# Patient Record
Sex: Male | Born: 1960 | Race: Black or African American | Hispanic: No | Marital: Single | Smoking: Former smoker
Health system: Southern US, Community
[De-identification: ages and names within clinical notes are randomized; demographics above are authoritative.]

## PROBLEM LIST (undated history)

## (undated) DIAGNOSIS — F419 Anxiety disorder, unspecified: Secondary | ICD-10-CM

## (undated) DIAGNOSIS — F1010 Alcohol abuse, uncomplicated: Secondary | ICD-10-CM

## (undated) DIAGNOSIS — E119 Type 2 diabetes mellitus without complications: Secondary | ICD-10-CM

## (undated) DIAGNOSIS — I10 Essential (primary) hypertension: Secondary | ICD-10-CM

## (undated) HISTORY — PX: EYE SURGERY: SHX253

---

## 2005-03-17 ENCOUNTER — Emergency Department (HOSPITAL_COMMUNITY): Admission: EM | Admit: 2005-03-17 | Discharge: 2005-03-17 | Payer: Self-pay | Admitting: Emergency Medicine

## 2005-03-20 ENCOUNTER — Emergency Department (HOSPITAL_COMMUNITY): Admission: EM | Admit: 2005-03-20 | Discharge: 2005-03-20 | Payer: Self-pay | Admitting: *Deleted

## 2006-10-25 ENCOUNTER — Emergency Department (HOSPITAL_COMMUNITY): Admission: EM | Admit: 2006-10-25 | Discharge: 2006-10-25 | Payer: Self-pay | Admitting: Emergency Medicine

## 2014-07-19 ENCOUNTER — Other Ambulatory Visit (HOSPITAL_COMMUNITY)
Admission: RE | Admit: 2014-07-19 | Discharge: 2014-07-19 | Disposition: A | Discharge status: Home / Self Care | Payer: BC Managed Care – PPO | Source: Ambulatory Visit | Attending: Family Medicine | Admitting: Family Medicine

## 2014-07-19 ENCOUNTER — Emergency Department (INDEPENDENT_AMBULATORY_CARE_PROVIDER_SITE_OTHER)
Admission: EM | Admit: 2014-07-19 | Discharge: 2014-07-19 | Disposition: A | Discharge status: Home / Self Care | Payer: BC Managed Care – PPO | Source: Home / Self Care

## 2014-07-19 DIAGNOSIS — Z202 Contact with and (suspected) exposure to infections with a predominantly sexual mode of transmission: Secondary | ICD-10-CM

## 2014-07-19 DIAGNOSIS — IMO0001 Reserved for inherently not codable concepts without codable children: Secondary | ICD-10-CM

## 2014-07-19 DIAGNOSIS — Z113 Encounter for screening for infections with a predominantly sexual mode of transmission: Secondary | ICD-10-CM | POA: Insufficient documentation

## 2014-07-19 DIAGNOSIS — R030 Elevated blood-pressure reading, without diagnosis of hypertension: Secondary | ICD-10-CM

## 2014-07-19 MED ORDER — CEFTRIAXONE SODIUM 250 MG IJ SOLR
250.0000 mg | Freq: Once | INTRAMUSCULAR | Status: AC
  Administered 2014-07-19: 250 mg via INTRAMUSCULAR

## 2014-07-19 MED ORDER — CEFTRIAXONE SODIUM 250 MG IJ SOLR
INTRAMUSCULAR | Status: AC
  Filled 2014-07-19: qty 250

## 2014-07-19 MED ORDER — AZITHROMYCIN 250 MG PO TABS
1000.0000 mg | ORAL_TABLET | Freq: Once | ORAL | Status: AC
  Administered 2014-07-19: 1000 mg via ORAL

## 2014-07-19 MED ORDER — LIDOCAINE HCL (PF) 1 % IJ SOLN
INTRAMUSCULAR | Status: AC
  Filled 2014-07-19: qty 5

## 2014-07-19 MED ORDER — AZITHROMYCIN 250 MG PO TABS
ORAL_TABLET | ORAL | Status: AC
Start: 2014-07-19 — End: 2014-07-19
  Filled 2014-07-19: qty 4

## 2014-07-19 NOTE — ED Provider Notes (Signed)
Medical screening examination/treatment/procedure(s) were performed by a resident physician or non-physician practitioner and as the supervising physician I was immediately available for consultation/collaboration.  Evan Corey, MD    Evan S Corey, MD 07/19/14 1307 

## 2014-07-19 NOTE — ED Notes (Signed)
Patient here today for STD exposure, patient denied any symptoms but states that his partner told him that she had Chlamydia and it has been several days since she told him and he wants checked and treated

## 2014-07-19 NOTE — Discharge Instructions (Signed)
PRIMARY CARE Merchant navy officer at [employer] [address]  [place], [place] [phone]  Fax [phone]  Nature conservation officer at [facility] [address]  [place], [place] [phone]  Fax [phone]  Nature conservation officer at [place] / [place] [phone] [place], [place] [phone]  Fax [phone]  [facility] at [facility] [address]  [place], [place] [phone]  Fax [phone]  [facility] [address] [place] [address] [place], [place] [phone]  Fax [phone]  [facility] at [facility] [address] [place], [place] [phone]  Fax [phone]   [facility] @ [place] [address] [place] [address] Phone: [phone]   [facility] @ [facility] [address] [place] [address] Phone: [phone]   [facility] Medicine @ [place] [address] [place] [address] Phone: [phone]   [facility] @ [place] [address] [place] [address] Phone: [phone]   [facility] @ [place] [address] [place] [address] Phone: [phone] Fax: [phone]  [facility] @ [place] [address] [place] [address] [address] Phone: [phone]    Safe Sex Safe sex is about reducing the risk of giving or getting a sexually transmitted disease (STD). STDs are spread through sexual contact involving the genitals, mouth, or rectum. Some STDs can be cured and others cannot. Safe sex can also prevent unintended pregnancies.  WHAT ARE SOME SAFE SEX PRACTICES?  Limit your sexual activity to only one partner who is having sex with only you.  Talk to your partner about his or her past partners, past STDs, and drug use.  Use a  condom every time you have sexual intercourse. This includes vaginal, oral, and anal sexual activity. Both females and males should wear condoms during oral sex. Only use latex or polyurethane condoms and water-based lubricants. Using petroleum-based lubricants or oils to lubricate a condom will weaken the condom and increase the chance that it will break. The condom should be in place from the beginning to the end of sexual activity. Wearing a condom reduces, but does not completely eliminate, your risk of getting or giving an STD. STDs can be spread by contact with infected body fluids and skin.  Get vaccinated for hepatitis B and HPV.  Avoid alcohol and recreational drugs, which can affect your judgment. You may forget to use a condom or participate in high-risk sex.  For females, avoid douching after sexual intercourse. Douching can spread an infection farther into the reproductive tract.  Check your body for signs of sores, blisters, rashes, or unusual discharge. See your health care provider if you notice any of these signs.  Avoid sexual contact if you have symptoms of an infection or are being treated for an STD. If you or your partner has herpes, avoid sexual contact when blisters are present. Use condoms at all other times.  If you are at risk of being infected with HIV, it is recommended that you take a prescription medicine daily to prevent HIV infection. This is called pre-exposure prophylaxis (PrEP). You are considered at risk if:  You are a man who has sex with other men (MSM).  You are a heterosexual man or woman who  is sexually active with more than one partner.  You take drugs by injection.  You are sexually active with a partner who has HIV.  Talk with your health care provider about whether you are at high risk of being infected with HIV. If you choose to begin PrEP, you should first be tested for HIV. You should then be tested every 3 months for as long as you are taking  PrEP.  See your health care provider for regular screenings, exams, and tests for other STDs. Before having sex with a new partner, each of you should be screened for STDs and should talk about the results with each other. WHAT ARE THE BENEFITS OF SAFE SEX?   There is less chance of getting or giving an STD.  You can prevent unwanted or unintended pregnancies.  By discussing safe sex concerns with your partner, you may increase feelings of intimacy, comfort, trust, and honesty between the two of you. Document Released: 11/23/2004 Document Revised: 03/02/2014 Document Reviewed: 04/08/2012 [facility] Patient Information 2015 [place], [place]. This information is not intended to replace advice given to you by your health care provider. Make sure you discuss any questions you have with your health care provider.

## 2014-07-19 NOTE — ED Provider Notes (Signed)
CSN: [id number]     Arrival date & time 07/19/14  1151 History   None    Chief Complaint  Patient presents with  . Exposure to STD   (Consider location/radiation/quality/duration/timing/severity/associated sxs/prior Treatment)  HPI  Patient is a 53 year old male presenting today with exposure to gonorrhea B. unprotected sex approximately one month ago. Patient admits to infrequent condom use. The patient denies any generalized malaise, fever, chills, eye problems, sore throat, skin rash or joint pain.  Denies abdominal pain, nausea, or vomiting. In addition, the patient denies any dysuria, burning, testicular pain or swelling, lesions of the penis or penile discharge.  Patient's blood pressure is elevated upon admission. Patient denies history of hypertension or elevated readings.  Denies any headache or dizziness.   No past medical history on file. No past surgical history on file. No family history on file. History  Substance Use Topics  . Smoking status: Not on file  . Smokeless tobacco: Not on file  . Alcohol Use: Not on file    Review of Systems  Allergies  Review of patient's allergies indicates no known allergies.  Home Medications   Prior to Admission medications   Not on File   BP 180/103  Pulse 82  Temp(Src) 98.9 F (37.2 C) (Oral)  Resp 16  SpO2 96%  Physical Exam  Nursing note and vitals reviewed. Constitutional: He is oriented to person, place, and time. He appears well-developed and well-nourished. No distress.  Cardiovascular: Normal rate, regular rhythm, normal heart sounds and intact distal pulses.  Exam reveals no gallop and no friction rub.   No murmur heard. Pulmonary/Chest: Effort normal and breath sounds normal. No respiratory distress. He has no wheezes. He has no rales. He exhibits no tenderness.  Abdominal: Soft. Bowel sounds are normal. He exhibits no distension and no mass. There is no tenderness. There is no rebound and no guarding.   Genitourinary: Penis normal. No penile tenderness.  Neurological: He is alert and oriented to person, place, and time.  Skin: Skin is warm and dry. No rash noted. He is not diaphoretic. No erythema. No pallor.     ED Course  Procedures (including critical care time) Labs Review Labs Reviewed  URINE CYTOLOGY ANCILLARY ONLY    Imaging Review No results found.   MDM   1. Exposure to STD   2. Elevated blood pressure    Meds ordered this encounter  Medications  . cefTRIAXone (ROCEPHIN) injection 250 mg    Sig:   . azithromycin (ZITHROMAX) tablet 1,000 mg    Sig:    Patient has insurance and advised of need of primary care provider for monitoring and long-term management.   The patient was given appropriate handouts, self care instructions, and instructed in symptomatic relief.  The patient was instructed to inform all sexual contacts, avoid intercourse completely for 2 weeks and then only with a condom. The patient advised that we would call about all abnormal lab results, and that we would need to report certain kinds of infection to the health department.  The patient advised to follow up here if no better in 3 to 4 days, or sooner if becoming worse in any way, and given some red flag symptoms such as fever, pain, or difficulty urinating which would prompt immediate return. The patient verbalizes understanding and agrees to plan of care.      Servando Salina, NP 07/19/14 1248

## 2014-07-20 LAB — URINE CYTOLOGY ANCILLARY ONLY
CHLAMYDIA, DNA PROBE: NEGATIVE
Neisseria Gonorrhea: NEGATIVE
Trichomonas: NEGATIVE

## 2014-08-29 ENCOUNTER — Encounter (HOSPITAL_COMMUNITY): Payer: Self-pay | Admitting: Emergency Medicine

## 2014-08-29 ENCOUNTER — Emergency Department (INDEPENDENT_AMBULATORY_CARE_PROVIDER_SITE_OTHER)
Admission: EM | Admit: 2014-08-29 | Discharge: 2014-08-29 | Disposition: A | Discharge status: Home / Self Care | Payer: BC Managed Care – PPO | Source: Home / Self Care

## 2014-08-29 ENCOUNTER — Other Ambulatory Visit (HOSPITAL_COMMUNITY)
Admission: RE | Admit: 2014-08-29 | Discharge: 2014-08-29 | Disposition: A | Discharge status: Home / Self Care | Payer: BC Managed Care – PPO | Source: Ambulatory Visit | Attending: Family Medicine | Admitting: Family Medicine

## 2014-08-29 DIAGNOSIS — Z113 Encounter for screening for infections with a predominantly sexual mode of transmission: Secondary | ICD-10-CM | POA: Diagnosis not present

## 2014-08-29 DIAGNOSIS — Z202 Contact with and (suspected) exposure to infections with a predominantly sexual mode of transmission: Secondary | ICD-10-CM

## 2014-08-29 HISTORY — DX: Alcohol abuse, uncomplicated: F10.10

## 2014-08-29 NOTE — ED Notes (Signed)
Pt states that he was exposed to STD over a month ago and wants to follow up with STD exposure. Pt states he has no penile discharge or pain. Pt is in no acute distress at this time.

## 2014-08-29 NOTE — ED Provider Notes (Signed)
CSN: [id number]     Arrival date & time 08/29/14  0902 History   First MD Initiated Contact with Patient 08/29/14 [phone]     Chief Complaint  Patient presents with  . Exposure to STD   (Consider location/radiation/quality/duration/timing/severity/associated sxs/prior Treatment) HPI Comments: Was here 07/19/14 for possible exposure to STD. Urine cytology was negative and empirically treated with Azithromycin and Rocephin IM He came in for another check as he has continued to have sex with his partner who continues to have a vaginal discharge. Pt st is completely asymptomatic as he was in the past visit.   Hx of HTN. He deliberately stopped his meds 1 yr ago. He was advised last visit to see PCP but has not. He is asymptomatic in regards to cardiac and Neuro sx's, st feels well overall.    Past Medical History  Diagnosis Date  . ETOH abuse    History reviewed. No pertinent past surgical history. History reviewed. No pertinent family history. History  Substance Use Topics  . Smoking status: Current Every Day Smoker    Types: Cigarettes  . Smokeless tobacco: Not on file  . Alcohol Use: Yes    Review of Systems  Constitutional: Negative.   Respiratory: Negative for cough and shortness of breath.   Cardiovascular: Negative for chest pain, palpitations and leg swelling.  Gastrointestinal: Negative for nausea, vomiting and abdominal pain.  Genitourinary: Negative for dysuria, urgency, frequency, discharge, penile swelling, scrotal swelling, genital sores, penile pain and testicular pain.  Skin: Negative.   Neurological: Negative for dizziness, tremors, seizures, syncope, facial asymmetry, speech difficulty, numbness and headaches.  All other systems reviewed and are negative.   Allergies  Review of patient's allergies indicates no known allergies.  Home Medications   Prior to Admission medications   Not on File   BP 175/115  Pulse 81  Temp(Src) 98.3 F (36.8 C) (Oral)  Resp  16  SpO2 97% Physical Exam  Nursing note and vitals reviewed. Constitutional: He is oriented to person, place, and time. He appears well-developed and well-nourished. No distress.  Neck: Normal range of motion. Neck supple.  Cardiovascular: Normal rate.   Pulmonary/Chest: Effort normal. No respiratory distress.  Musculoskeletal: He exhibits no edema.  Neurological: He is alert and oriented to person, place, and time.  Skin: Skin is warm and dry.  Psychiatric: He has a normal mood and affect.    ED Course  Procedures (including critical care time) Labs Review Labs Reviewed  URINE CYTOLOGY ANCILLARY ONLY    Imaging Review No results found.   MDM   1. Possible exposure to STD    Urine cytology pending Since he was negative for STD las t month and asymptomatic today as well, will wait on results. See [place]CP in [place] this week for BP management.      Hayden Rasmussenavid Flecia Shutter, NP 08/29/14 [phone]

## 2014-08-29 NOTE — ED Provider Notes (Signed)
Medical screening examination/treatment/procedure(s) were performed by resident physician or non-physician practitioner and as supervising physician I was immediately available for consultation/collaboration.   Jamon Hayhurst DOUGLAS MD.   Kasey Hansell D Tyheem Boughner, MD 08/29/14 1345 

## 2014-08-29 NOTE — Discharge Instructions (Signed)
Sexually Transmitted Disease °A sexually transmitted disease (STD) is a disease or infection that may be passed (transmitted) from person to person, usually during sexual activity. This may happen by way of saliva, semen, blood, vaginal mucus, or urine. Common STDs include:  °· Gonorrhea.   °· Chlamydia.   °· Syphilis.   °· HIV and AIDS.   °· Genital herpes.   °· Hepatitis B and C.   °· Trichomonas.   °· Human papillomavirus (HPV).   °· Pubic lice.   °· Scabies. °· Mites. °· Bacterial vaginosis. °WHAT ARE CAUSES OF STDs? °An STD may be caused by bacteria, a virus, or parasites. STDs are often transmitted during sexual activity if one person is infected. However, they may also be transmitted through nonsexual means. STDs may be transmitted after:  °· Sexual intercourse with an infected person.   °· Sharing sex toys with an infected person.   °· Sharing needles with an infected person or using unclean piercing or tattoo needles. °· Having intimate contact with the genitals, mouth, or rectal areas of an infected person.   °· Exposure to infected fluids during birth. °WHAT ARE THE SIGNS AND SYMPTOMS OF STDs? °Different STDs have different symptoms. Some people may not have any symptoms. If symptoms are present, they may include:  °· Painful or bloody urination.   °· Pain in the pelvis, abdomen, vagina, anus, throat, or eyes.   °· A skin rash, itching, or irritation. °· Growths, ulcerations, blisters, or sores in the genital and anal areas. °· Abnormal vaginal discharge with or without bad odor.   °· Penile discharge in men.   °· Fever.   °· Pain or bleeding during sexual intercourse.   °· Swollen glands in the groin area.   °· Yellow skin and eyes (jaundice). This is seen with hepatitis.   °· Swollen testicles. °· Infertility. °· Sores and blisters in the mouth. °HOW ARE STDs DIAGNOSED? °To make a diagnosis, your health care provider may:  °· Take a medical history.   °· Perform a physical exam.   °· Take a sample of  any discharge to examine. °· Swab the throat, cervix, opening to the penis, rectum, or vagina for testing. °· Test a sample of your first morning urine.   °· Perform blood tests.   °· Perform a Pap test, if this applies.   °· Perform a colposcopy.   °· Perform a laparoscopy.   °HOW ARE STDs TREATED? ° Treatment depends on the STD. Some STDs may be treated but not cured.  °· Chlamydia, gonorrhea, trichomonas, and syphilis can be cured with antibiotic medicine.   °· Genital herpes, hepatitis, and HIV can be treated, but not cured, with prescribed medicines. The medicines lessen symptoms.   °· Genital warts from HPV can be treated with medicine or by freezing, burning (electrocautery), or surgery. Warts may come back.   °· HPV cannot be cured with medicine or surgery. However, abnormal areas may be removed from the cervix, vagina, or vulva.   °· If your diagnosis is confirmed, your recent sexual partners need treatment. This is true even if they are symptom-free or have a negative culture or evaluation. They should not have sex until their health care providers say it is okay. °HOW CAN I REDUCE MY RISK OF GETTING AN STD? °Take these steps to reduce your risk of getting an STD: °· Use latex condoms, dental dams, and water-soluble lubricants during sexual activity. Do not use petroleum jelly or oils. °· Avoid having multiple sex partners. °· Do not have sex with someone who has other sex partners. °· Do not have sex with anyone you do not know or who is at   high risk for an STD. °· Avoid risky sex practices that can break your skin. °· Do not have sex if you have open sores on your mouth or skin. °· Avoid drinking too much alcohol or taking illegal drugs. Alcohol and drugs can affect your judgment and put you in a vulnerable position. °· Avoid engaging in oral and anal sex acts. °· Get vaccinated for HPV and hepatitis. If you have not received these vaccines in the past, talk to your health care provider about whether one  or both might be right for you.   °· If you are at risk of being infected with HIV, it is recommended that you take a prescription medicine daily to prevent HIV infection. This is called pre-exposure prophylaxis (PrEP). You are considered at risk if: °¨ You are a man who has sex with other men (MSM). °¨ You are a heterosexual man or woman and are sexually active with more than one partner. °¨ You take drugs by injection. °¨ You are sexually active with a partner who has HIV. °· Talk with your health care provider about whether you are at high risk of being infected with HIV. If you choose to begin PrEP, you should first be tested for HIV. You should then be tested every 3 months for as long as you are taking PrEP.   °WHAT SHOULD I DO IF I THINK I HAVE AN STD? °· See your health care provider.   °· Tell your sexual partner(s). They should be tested and treated for any STDs. °· Do not have sex until your health care provider says it is okay.  °WHEN SHOULD I GET IMMEDIATE MEDICAL CARE? °Contact your health care provider right away if:  °· You have severe abdominal pain. °· You are a man and notice swelling or pain in your testicles. °· You are a woman and notice swelling or pain in your vagina. °Document Released: 01/06/2003 Document Revised: 10/21/2013 Document Reviewed: 05/06/2013 °Exit[employer]® Patient Information ©2015 [employer]. This information is not intended to replace advice given to you by your health care provider. Make sure you discuss any questions you have with your health care provider. ° °Safe Sex °Safe sex is about reducing the risk of giving or getting a sexually transmitted disease (STD). STDs are spread through sexual contact involving the genitals, mouth, or rectum. Some STDs can be cured and others cannot. Safe sex can also prevent unintended pregnancies.  °WHAT ARE SOME SAFE SEX PRACTICES? °· Limit your sexual activity to only one partner who is having sex with only you. °· Talk to your partner  about his or her past partners, past STDs, and drug use. °· Use a condom every time you have sexual intercourse. This includes vaginal, oral, and anal sexual activity. Both females and males should wear condoms during oral sex. Only use latex or polyurethane condoms and water-based lubricants. Using petroleum-based lubricants or oils to lubricate a condom will weaken the condom and increase the chance that it will break. The condom should be in place from the beginning to the end of sexual activity. Wearing a condom reduces, but does not completely eliminate, your risk of getting or giving an STD. STDs can be spread by contact with infected body fluids and skin. °· Get vaccinated for hepatitis B and HPV. °· Avoid alcohol and recreational drugs, which can affect your judgment. You may forget to use a condom or participate in high-risk sex. °· For females, avoid douching after sexual intercourse. Douching can spread an infection   farther into the reproductive tract. °· Check your body for signs of sores, blisters, rashes, or unusual discharge. See your health care provider if you notice any of these signs. °· Avoid sexual contact if you have symptoms of an infection or are being treated for an STD. If you or your partner has herpes, avoid sexual contact when blisters are present. Use condoms at all other times. °· If you are at risk of being infected with HIV, it is recommended that you take a prescription medicine daily to prevent HIV infection. This is called pre-exposure prophylaxis (PrEP). You are considered at risk if: °¨ You are a man who has sex with other men (MSM). °¨ You are a heterosexual man or woman who is sexually active with more than one partner. °¨ You take drugs by injection. °¨ You are sexually active with a partner who has HIV. °· Talk with your health care provider about whether you are at high risk of being infected with HIV. If you choose to begin PrEP, you should first be tested for HIV. You  should then be tested every 3 months for as long as you are taking PrEP. °· See your health care provider for regular screenings, exams, and tests for other STDs. Before having sex with a new partner, each of you should be screened for STDs and should talk about the results with each other. °WHAT ARE THE BENEFITS OF SAFE SEX?  °· There is less chance of getting or giving an STD. °· You can prevent unwanted or unintended pregnancies. °· By discussing safe sex concerns with your partner, you may increase feelings of intimacy, comfort, trust, and honesty between the two of you. °Document Released: 11/23/2004 Document Revised: 03/02/2014 Document Reviewed: 04/08/2012 °[employer]® Patient Information ©2015 [employer]. This information is not intended to replace advice given to you by your health care provider. Make sure you discuss any questions you have with your health care provider. ° °

## 2014-08-31 ENCOUNTER — Encounter (HOSPITAL_COMMUNITY): Payer: Self-pay | Admitting: Emergency Medicine

## 2014-08-31 ENCOUNTER — Emergency Department (INDEPENDENT_AMBULATORY_CARE_PROVIDER_SITE_OTHER)
Admission: EM | Admit: 2014-08-31 | Discharge: 2014-08-31 | Disposition: A | Discharge status: Home / Self Care | Payer: BC Managed Care – PPO | Source: Home / Self Care | Attending: Emergency Medicine | Admitting: Emergency Medicine

## 2014-08-31 DIAGNOSIS — Z202 Contact with and (suspected) exposure to infections with a predominantly sexual mode of transmission: Secondary | ICD-10-CM

## 2014-08-31 LAB — URINE CYTOLOGY ANCILLARY ONLY
CHLAMYDIA, DNA PROBE: NEGATIVE
Neisseria Gonorrhea: NEGATIVE
Trichomonas: NEGATIVE

## 2014-08-31 MED ORDER — AZITHROMYCIN 250 MG PO TABS
ORAL_TABLET | ORAL | Status: AC
  Filled 2014-08-31: qty 4

## 2014-08-31 MED ORDER — CEFTRIAXONE SODIUM 250 MG IJ SOLR
250.0000 mg | Freq: Once | INTRAMUSCULAR | Status: AC
  Administered 2014-08-31: 250 mg via INTRAMUSCULAR

## 2014-08-31 MED ORDER — CEFTRIAXONE SODIUM 250 MG IJ SOLR
INTRAMUSCULAR | Status: AC
  Filled 2014-08-31: qty 250

## 2014-08-31 MED ORDER — METRONIDAZOLE 500 MG PO TABS
ORAL_TABLET | ORAL | Status: AC
  Filled 2014-08-31: qty 4

## 2014-08-31 MED ORDER — LIDOCAINE HCL (PF) 1 % IJ SOLN
INTRAMUSCULAR | Status: AC
  Filled 2014-08-31: qty 5

## 2014-08-31 MED ORDER — METRONIDAZOLE 500 MG PO TABS
2000.0000 mg | ORAL_TABLET | Freq: Once | ORAL | Status: AC
  Administered 2014-08-31: 2000 mg via ORAL

## 2014-08-31 MED ORDER — AZITHROMYCIN 250 MG PO TABS
1000.0000 mg | ORAL_TABLET | Freq: Once | ORAL | Status: AC
  Administered 2014-08-31: 1000 mg via ORAL

## 2014-08-31 NOTE — ED Notes (Signed)
Patient is aware that there is a post injection delay prior to discharge.

## 2014-08-31 NOTE — ED Notes (Signed)
Patient agreed to graham crackers/peanut butter/gingerale.  Cautioned patient needed to eat something when taking po medicines with concerns for nausea.

## 2014-08-31 NOTE — ED Notes (Signed)
Patient not ready for discharge, orders pending/antibiotic injection post-injection delay prior to discharge

## 2014-08-31 NOTE — ED Provider Notes (Signed)
  Chief Complaint   Exposure to STD   History of Present Illness   Christian Aguirre is A 53 year old male who was just here 2 days ago for STD testing. He had DNA probe was done, but declined HIV or syphilis testing. Results are not back yet. He was not treated when he was here. He found out from his partner that she tested positive for trichomonas, so he decided he would like to get treated for Trichomonas as well as other STDs. He himself denies any symptoms such as urethral discharge, burning, itching, penile pain, penile lesions, inguinal adenopathy, or testicular pain or swelling. He has had no fever, chills, sore throat, skin rash, or joint pain.  Review of Systems   Other than as noted above, the patient denies any of the following symptoms: Systemic:  No fevers chills, arthralgias, or adenopathy. GI:  No abdominal pain, nausea or vomiting. GU:  No dysuria, penile pain, discharge, itching, dysuria, genital lesions, testicular pain or swelling. Skin:  No rash or itching.  PMFSH   Past medical history, family history, social history, meds, and allergies were reviewed.   Physical Examination    Vital signs:  BP 157/103 mmHg  Pulse 80  Temp(Src) 98.2 F (36.8 C) (Oral)  Resp 16  SpO2 96% Gen:  Alert, oriented, in no distress. ENT:  Pharynx clear, no oral lesions.  Abdomen:  Soft and flat, non-distended, and non-tender.  No organomegaly or mass. Genital:  There was no urethral discharge, no penile lesions, no inguinal adenopathy, testes were normal. Skin:  Warm and dry.  No rash.   Course in Urgent Care Center   The following medications were given:  Medications  cefTRIAXone (ROCEPHIN) injection 250 mg   azithromycin (ZITHROMAX) tablet 1,000 mg   metroNIDAZOLE (FLAGYL) tablet 2,000 mg    Assessment   The encounter diagnosis was STD exposure.  Plan    1.  Meds:  The following meds were prescribed:   New Prescriptions   No medications on file    2.  Patient  Education/Counseling:  The patient was given appropriate handouts, self care instructions, and instructed in symptomatic relief.The patient was instructed to inform all sexual contacts, avoid intercourse completely for 2 weeks and then only with a condom.  The patient was told that we would call about all abnormal lab results, and that we would need to report certain kinds of infection to the health department.    3.  Follow up:  The patient was told to follow up here if no better in 3 to 4 days, or sooner if becoming worse in any way, and given some red flag symptoms such as fever, pain, or difficulty urinating which would prompt immediate return.      Christian Likesavid C Sam Overbeck, MD 08/31/14 [phone]

## 2014-08-31 NOTE — ED Notes (Signed)
Partner is being treated for trichomonas.partner treated 2 days ago.  Wants to be treated

## 2014-08-31 NOTE — Discharge Instructions (Signed)
You have been diagnosed with a possible STD.  Your results should be back in  1 - 3 days.  We will call you with the results of any positive tests, so if you don't hear from us, you can assume your results are all negative.  If you wish, you can call us here at [phone] and ask for a nurse to give you the results.  They can give you the results of all your tests over the phone.  If your HIV should come back positive, we must give you this result in person to protect your confidentiality.  We can give you a negative HIV result over the phone.   ° °In the meantime, you should avoid intercourse altogether for 1 week.  After that, you should always use condoms--100% of the time.  This will not only prevent pregnancy, but has been shown to prevent HIV, syphilis, gonorrhea, chlamydia, hepatis C and other STDs. ° °If your test comes back positive, we are required by law to report it to the Health Department.  We also suggest you inform your partner or partners so they can get tested and treated as well. ° °You can get STD testing for free at the [facility].  It is recommended that you have repeat testing for HIV and syphilis in 3 and 6 months, since it can take a while for these tests to become positive.  ° °

## 2015-12-08 DIAGNOSIS — I10 Essential (primary) hypertension: Secondary | ICD-10-CM | POA: Insufficient documentation

## 2015-12-08 DIAGNOSIS — E785 Hyperlipidemia, unspecified: Secondary | ICD-10-CM | POA: Insufficient documentation

## 2015-12-08 DIAGNOSIS — E119 Type 2 diabetes mellitus without complications: Secondary | ICD-10-CM | POA: Insufficient documentation

## 2016-08-08 ENCOUNTER — Encounter (HOSPITAL_COMMUNITY): Payer: Self-pay | Admitting: *Deleted

## 2016-08-08 ENCOUNTER — Ambulatory Visit (HOSPITAL_COMMUNITY)
Admission: EM | Admit: 2016-08-08 | Discharge: 2016-08-08 | Disposition: A | Discharge status: Home / Self Care | Payer: BLUE CROSS/BLUE SHIELD | Attending: Emergency Medicine | Admitting: Emergency Medicine

## 2016-08-08 DIAGNOSIS — H6122 Impacted cerumen, left ear: Secondary | ICD-10-CM | POA: Diagnosis not present

## 2016-08-08 HISTORY — DX: Essential (primary) hypertension: I10

## 2016-08-08 NOTE — ED Triage Notes (Signed)
Pt  Reports   Symptoms  Of  l  Ear  Fullness  Has  Tried   otc  remedys       Symptoms  X  3  Days

## 2016-08-08 NOTE — ED Provider Notes (Signed)
HPI  SUBJECTIVE:  Christian Aguirre is a 55 y.o. male who presents with left ear "clogged" for the past 2 days. He reports decreased hearing, tinnitus, some popping when yawning. He denies pain, nausea, vomiting, fevers, foreign body insertion, otorrhea, nasal congestion, URI or allergy type symptoms, recent swimming. She has tried Debrox to clean it out without much improvement. There are no other aggravating or alleviating factors. PMH of diabetes and hypertension. PM[facility], unable to remember the name.     Past Medical History:  Diagnosis Date  . ETOH abuse   . Hypertension     History reviewed. No pertinent surgical history.  History reviewed. No pertinent family history.  Social History  Substance Use Topics  . Smoking status: Never Smoker  . Smokeless tobacco: Not on file  . Alcohol use Yes    No current facility-administered medications for this encounter.   Current Outpatient Prescriptions:  Marland Kitchen.  METFORMIN HCL PO, Take by mouth., Disp: , Rfl:   No Known Allergies   ROS  As noted in HPI.   Physical Exam  BP 152/96 (BP Location: Right Arm)   Pulse 80   Temp 98.6 F (37 C) (Oral)   SpO2 100%   Constitutional: Well developed, well nourished, no acute distress Eyes:  EOMI, conjunctiva normal bilaterally HENT: Normocephalic, atraumatic,mucus membranes moist.. No nasal congestion. Positive complete obstruction of left ear with cerumen. Unable to visualize TM at all. No pain with traction on pinna. Respiratory: Normal inspiratory effort Cardiovascular: Normal rate GI: nondistended skin: No rash, skin intact Musculoskeletal: no deformities Neurologic: Alert & oriented x 3, no focal neuro deficits Psychiatric: Speech and behavior appropriate   ED Course   Medications - No data to display  Orders Placed This Encounter  Procedures  . Ear wax removal    Standing Status:   Standing    Number of Occurrences:   1    No results found for this  or any previous visit (from the past 24 hour(s)). No results found.  ED Clinical Impression  Impacted cerumen of left ear   ED Assessment/Plan  We'll have L  ear irrigated and then reevaluate.  On reevaluation, patient states that his hearing is completely recovered. Hearing is grossly intact bilaterally. External ear canal normal, TMs normal.  Meds ordered this encounter  Medications  . METFORMIN HCL PO    Sig: Take by mouth.    *This clinic note was created using Dragon dictation software. Therefore, there may be occasional mistakes despite careful proofreading.  ?   Domenick GongAshley Kirstin Kugler, MD 08/08/16 1135

## 2016-08-08 NOTE — Discharge Instructions (Signed)
Debrox once a week to prevent buildup. Do not stick Q-tips or other such things in your ear.

## 2017-07-22 ENCOUNTER — Encounter (HOSPITAL_COMMUNITY): Payer: Self-pay | Admitting: Emergency Medicine

## 2017-07-22 ENCOUNTER — Emergency Department (HOSPITAL_COMMUNITY)
Admission: EM | Admit: 2017-07-22 | Discharge: 2017-07-22 | Disposition: A | Discharge status: Home / Self Care | Payer: BLUE CROSS/BLUE SHIELD | Attending: Emergency Medicine | Admitting: Emergency Medicine

## 2017-07-22 DIAGNOSIS — I10 Essential (primary) hypertension: Secondary | ICD-10-CM | POA: Insufficient documentation

## 2017-07-22 DIAGNOSIS — K047 Periapical abscess without sinus: Secondary | ICD-10-CM | POA: Diagnosis not present

## 2017-07-22 MED ORDER — PENICILLIN V POTASSIUM 250 MG PO TABS
500.0000 mg | ORAL_TABLET | Freq: Once | ORAL | Status: AC
  Administered 2017-07-22: 500 mg via ORAL
  Filled 2017-07-22: qty 2

## 2017-07-22 MED ORDER — OXYCODONE-ACETAMINOPHEN 5-325 MG PO TABS
ORAL_TABLET | ORAL | Status: AC
  Filled 2017-07-22: qty 1

## 2017-07-22 MED ORDER — LIDOCAINE-EPINEPHRINE (PF) 2 %-1:200000 IJ SOLN
10.0000 mL | Freq: Once | INTRAMUSCULAR | Status: AC
  Administered 2017-07-22: 10 mL
  Filled 2017-07-22: qty 20

## 2017-07-22 MED ORDER — IBUPROFEN 400 MG PO TABS
400.0000 mg | ORAL_TABLET | Freq: Once | ORAL | Status: AC | PRN
  Administered 2017-07-22: 400 mg via ORAL

## 2017-07-22 MED ORDER — BUPIVACAINE-EPINEPHRINE (PF) 0.5% -1:200000 IJ SOLN
1.8000 mL | Freq: Once | INTRAMUSCULAR | Status: AC
  Administered 2017-07-22: 1.8 mL
  Filled 2017-07-22: qty 1.8

## 2017-07-22 MED ORDER — PENICILLIN V POTASSIUM 500 MG PO TABS: 500.0000 mg | ORAL_TABLET | Freq: Three times a day (TID) | ORAL | 0 refills | Status: DC

## 2017-07-22 MED ORDER — OXYCODONE-ACETAMINOPHEN 5-325 MG PO TABS: 1.0000 | ORAL_TABLET | Freq: Four times a day (QID) | ORAL | 0 refills | Status: DC | PRN

## 2017-07-22 MED ORDER — OXYCODONE-ACETAMINOPHEN 5-325 MG PO TABS
1.0000 | ORAL_TABLET | ORAL | Status: DC | PRN
  Administered 2017-07-22: 1 via ORAL

## 2017-07-22 NOTE — ED Notes (Signed)
Pt up to nurse first desk asking for pain medications, see new orders from triage standing order set.. Advised of side effects of narcotic pain medication and instructed to avoid driving for a minimum of four hours.

## 2017-07-22 NOTE — ED Provider Notes (Signed)
[facility] Provider Note   CSN: [id number] Arrival date & time: 07/22/17  7829     History   Chief Complaint Chief Complaint  Patient presents with  . Abscess    dental right lower    HPI Christian Aguirre is a 56 y.o. male.  Patient presents with complaint of right lower jaw swelling and pain starting one day ago. No associated fevers, difficult to breathing or swallowing. Patient reports having "bad teeth" in the past. No treatments prior to arrival.       Past Medical History:  Diagnosis Date  . ETOH abuse   . Hypertension     There are no active problems to display for this patient.   History reviewed. No pertinent surgical history.     Home Medications    Prior to Admission medications   Medication Sig Start Date End Date Taking? Authorizing Provider  METFORMIN HCL PO Take by mouth.    [provider]    Family History No family history on file.  Social History Social History  Substance Use Topics  . Smoking status: Never Smoker  . Smokeless tobacco: Not on file  . Alcohol use Yes     Allergies   Patient has no known allergies.   Review of Systems Review of Systems  Constitutional: Negative for fever.  HENT: Positive for dental problem and facial swelling. Negative for ear pain, sore throat and trouble swallowing.   Respiratory: Negative for shortness of breath and stridor.   Musculoskeletal: Negative for neck pain.  Skin: Negative for color change.  Neurological: Negative for headaches.     Physical Exam Updated Vital Signs BP (!) 161/100 (BP Location: Right Arm)   Pulse 74   Temp 98.2 F (36.8 C) (Oral)   Resp 18   Ht 6' (1.829 m)   Wt 113.4 kg (250 lb)   SpO2 97%   BMI 33.91 kg/m   Physical Exam  Constitutional: He appears well-developed and well-nourished.  HENT:  Head: Normocephalic and atraumatic.  Right Ear: Tympanic membrane, external ear and ear canal normal.  Left Ear: Tympanic membrane,  external ear and ear canal normal.  Nose: Nose normal.  Mouth/Throat: Uvula is midline, oropharynx is clear and moist and mucous membranes are normal. No trismus in the jaw. Abnormal dentition. Dental caries present. No dental abscesses or uvula swelling. No tonsillar abscesses.  Advanced periodontal disease with poor dentition and multiple broken teeth. There is a 2 cm superficial abscess noted along the right gumline near the premolars. Mild fluctuance.  Eyes: Pupils are equal, round, and reactive to light.  Neck: Normal range of motion. Neck supple.  No neck swelling or Lugwig's angina  Neurological: He is alert.  Skin: Skin is warm and dry.  Psychiatric: He has a normal mood and affect.  Nursing note and vitals reviewed.    ED Treatments / Results   Procedures .Marland KitchenIncision and Drainage Date/Time: 07/22/2017 8:39 AM Performed by: Renne Crigler Authorized by: Renne Crigler   Consent:    Consent obtained:  Verbal   Consent given by:  Patient   Risks discussed:  Bleeding, pain and incomplete drainage Location:    Type:  Abscess   Location:  Mouth   Mouth location:  Submandibular space Anesthesia (see MAR for exact dosages):    Anesthesia method:  Local infiltration   Local anesthetic:  Bupivacaine 0.5% WITH epi and lidocaine 2% WITH epi Procedure type:    Complexity:  Simple Procedure details:  Incision types:  Stab incision   Scalpel blade:  11   Wound management:  Probed and deloculated   Drainage:  Bloody and purulent   Drainage amount:  Moderate   Wound treatment:  Wound left open   (including critical care time)  Medications Ordered in ED Medications  oxyCODONE-acetaminophen (PERCOCET/ROXICET) 5-325 MG per tablet 1 tablet (1 tablet Oral Given 07/22/17 0502)  penicillin v potassium (VEETID) tablet 500 mg (not administered)  ibuprofen (ADVIL,MOTRIN) tablet 400 mg (400 mg Oral Given 07/22/17 0104)  bupivacaine-epinephrine (MARCAINE W/ EPI) 0.5% -1:200000 injection  1.8 mL (1.8 mLs Infiltration Given 07/22/17 0813)  lidocaine-EPINEPHrine (XYLOCAINE W/EPI) 2 %-1:200000 (PF) injection 10 mL (10 mLs Infiltration Given 07/22/17 0813)     Initial Impression / Assessment and Plan / ED Course  I have reviewed the triage vital signs and the nursing notes.  Pertinent labs & imaging results that were available during my care of the patient were reviewed by me and considered in my medical decision making (see chart for details).     Patient seen and examined. Explained I&D procedure. Pt agrees to proceed. Anticipate d/c home with penicillin and pain medication. [place] controlled substance reporting database reviewed.   Vital signs reviewed and are as follows: BP (!) 161/100 (BP Location: Right Arm)   Pulse 74   Temp 98.2 F (36.8 C) (Oral)   Resp 18   Ht 6' (1.829 m)   Wt 113.4 kg (250 lb)   SpO2 97%   BMI 33.91 kg/m   Patient counseled on use of narcotic pain medications. Counseled not to combine these medications with others containing tylenol. Urged not to drink alcohol, drive, or perform any other activities that requires focus while taking these medications. The patient verbalizes understanding and agrees with the plan.  Patient counseled to take prescribed medications as directed, return with worsening facial or neck swelling, and to follow-up with their dentist as soon as possible.     Final Clinical Impressions(s) / ED Diagnoses   Final diagnoses:  Dental abscess   Patient with toothache. No fever. Exam unconcerning for Ludwig's angina or other deep tissue infection in neck.   Incision and drainage performed as above.  As there is gum swelling, erythema, and facial swelling, will treat with antibiotic and pain medicine. Urged patient to follow-up with dentist.    New Prescriptions New Prescriptions   OXYCODONE-ACETAMINOPHEN (PERCOCET/ROXICET) 5-325 MG TABLET    Take 1-2 tablets by mouth every 6 (six) hours as needed for severe pain.    PENICILLIN V POTASSIUM (VEETID) 500 MG TABLET    Take 1 tablet (500 mg total) by mouth 3 (three) times daily.     Renne Crigler, PA-C 07/22/17 0840    Margarita Grizzle, MD 07/23/17 1115

## 2017-07-22 NOTE — Discharge Instructions (Signed)
Please read and follow all provided instructions.  Your diagnoses today include:  1. Dental abscess     The exam and treatment you received today has been provided on an emergency basis only. This is not a substitute for complete medical or dental care.  Tests performed today include:  Vital signs. See below for your results today.   Medications prescribed:   Percocet (oxycodone/acetaminophen) - narcotic pain medication  DO NOT drive or perform any activities that require you to be awake and alert because this medicine can make you drowsy. BE VERY CAREFUL not to take multiple medicines containing Tylenol (also called acetaminophen). Doing so can lead to an overdose which can damage your liver and cause liver failure and possibly death.   Penicillin - antibiotic  You have been prescribed an antibiotic medicine: take the entire course of medicine even if you are feeling better. Stopping early can cause the antibiotic not to work.  Take any prescribed medications only as directed.  Home care instructions:  Follow any educational materials contained in this packet.  Follow-up instructions: Please follow-up with your dentist for further evaluation of your symptoms.   Dental Assistance: See attached for dental referrals  Return instructions:   Please return to the Emergency Department if you experience worsening symptoms.  Please return if you develop a fever, you develop more swelling in your face or neck, you have trouble breathing or swallowing food.  Please return if you have any other emergent concerns.  Additional Information:  Your vital signs today were: BP (!) 130/100 (BP Location: Right Arm)    Pulse 74    Temp 98 F (36.7 C) (Oral)    Resp 16    Ht 6' (1.829 m)    Wt 113.4 kg (250 lb)    SpO2 99%    BMI 33.91 kg/m  If your blood pressure (BP) was elevated above 135/85 this visit, please have this repeated by your doctor within one month. --------------

## 2017-07-22 NOTE — ED Triage Notes (Signed)
Pt c/o 10/10 right lower dental pain and swollen since this morning with not relief, pt denies any fever or chills.

## 2017-10-29 ENCOUNTER — Ambulatory Visit (HOSPITAL_COMMUNITY)
Admission: EM | Admit: 2017-10-29 | Discharge: 2017-10-29 | Disposition: A | Discharge status: Home / Self Care | Payer: BLUE CROSS/BLUE SHIELD | Attending: Family Medicine | Admitting: Family Medicine

## 2017-10-29 ENCOUNTER — Other Ambulatory Visit: Payer: Self-pay

## 2017-10-29 DIAGNOSIS — R208 Other disturbances of skin sensation: Secondary | ICD-10-CM | POA: Insufficient documentation

## 2017-10-29 DIAGNOSIS — N4889 Other specified disorders of penis: Secondary | ICD-10-CM | POA: Diagnosis not present

## 2017-10-29 DIAGNOSIS — I10 Essential (primary) hypertension: Secondary | ICD-10-CM | POA: Diagnosis not present

## 2017-10-29 DIAGNOSIS — Z113 Encounter for screening for infections with a predominantly sexual mode of transmission: Secondary | ICD-10-CM | POA: Diagnosis not present

## 2017-10-29 DIAGNOSIS — Z202 Contact with and (suspected) exposure to infections with a predominantly sexual mode of transmission: Secondary | ICD-10-CM

## 2017-10-29 LAB — POCT URINALYSIS DIP (DEVICE)
Bilirubin Urine: NEGATIVE
Glucose, UA: NEGATIVE mg/dL
HGB URINE DIPSTICK: NEGATIVE
Ketones, ur: NEGATIVE mg/dL
Leukocytes, UA: NEGATIVE
Nitrite: NEGATIVE
Protein, ur: NEGATIVE mg/dL
Specific Gravity, Urine: >=1.03 (ref 1.005–1.030)
UROBILINOGEN UA: 0.2 mg/dL (ref 0.0–1.0)
pH: 5.5 (ref 5.0–8.0)

## 2017-10-29 MED ORDER — AZITHROMYCIN 250 MG PO TABS
1000.0000 mg | ORAL_TABLET | Freq: Once | ORAL | Status: AC
  Administered 2017-10-29: 1000 mg via ORAL

## 2017-10-29 MED ORDER — AZITHROMYCIN 250 MG PO TABS
ORAL_TABLET | ORAL | Status: AC
  Filled 2017-10-29: qty 4

## 2017-10-29 MED ORDER — CEFTRIAXONE SODIUM 250 MG IJ SOLR
250.0000 mg | Freq: Once | INTRAMUSCULAR | Status: AC
  Administered 2017-10-29: 250 mg via INTRAMUSCULAR

## 2017-10-29 MED ORDER — CEFTRIAXONE SODIUM 250 MG IJ SOLR
INTRAMUSCULAR | Status: AC
  Filled 2017-10-29: qty 250

## 2017-10-29 NOTE — ED Provider Notes (Signed)
  [facility]   [id number] 10/29/17 Arrival Time: 1006  ASSESSMENT & PLAN:  1. Possible exposure to STD    Meds ordered this encounter  Medications  . cefTRIAXone (ROCEPHIN) injection 250 mg  . azithromycin (ZITHROMAX) tablet 1,000 mg   Urine cytology sent. Will notify of any positive results. Instructed to refrain from sexual activity for at least seven days.  Reviewed expectations re: course of current medical issues. Questions answered. Outlined signs and symptoms indicating need for more acute intervention. Patient verbalized understanding. After Visit Summary given.   SUBJECTIVE:  Christian Aguirre is a 56 y.o. male who presents with complaint of possible exposure to STD. Reports having intercourse with a new male partner when his condom slipped off. This was several days ago. Now reports slight burning at tip of penis with urination. No urinary frequency. No penile d/c reported. Afebrile. No abdominal or pelvic pain. No n/v. No rashes or lesions.  OTC treatment: None. History of similar symptoms: No.  ROS: As per HPI.  OBJECTIVE:  Vitals:   10/29/17 1031  BP: (!) 152/94  Pulse: 82  Temp: 98.5 F (36.9 C)  TempSrc: Oral  SpO2: 100%    General appearance: alert, cooperative, appears stated age and no distress Throat: lips, mucosa, and tongue normal; teeth and gums normal Back: no CVA tenderness Abdomen: soft, non-tender; bowel sounds normal; no masses or organomegaly; no guarding or rebound tenderness GU: declines Skin: warm and dry Psychological:  Alert and cooperative. Normal mood and affect.  Results for orders placed or performed during the hospital encounter of 10/29/17  POCT urinalysis dip (device)  Result Value Ref Range   Glucose, UA NEGATIVE NEGATIVE mg/dL   Bilirubin Urine NEGATIVE NEGATIVE   Ketones, ur NEGATIVE NEGATIVE mg/dL   Specific Gravity, Urine >=1.030 1.005 - 1.030   Hgb urine dipstick NEGATIVE NEGATIVE   pH 5.5 5.0 - 8.0   Protein, ur NEGATIVE NEGATIVE mg/dL   Urobilinogen, UA 0.2 0.0 - 1.0 mg/dL   Nitrite NEGATIVE NEGATIVE   Leukocytes, UA NEGATIVE NEGATIVE    Labs Reviewed  POCT URINALYSIS DIP (DEVICE)  URINE CYTOLOGY ANCILLARY ONLY    No Known Allergies  Past Medical History:  Diagnosis Date  . ETOH abuse   . Hypertension    No family history on file. Social History   Socioeconomic History  . Marital status: Single    Spouse name: Not on file  . Number of children: Not on file  . Years of education: Not on file  . Highest education level: Not on file  Social Needs  . Financial resource strain: Not on file  . Food insecurity - worry: Not on file  . Food insecurity - inability: Not on file  . Transportation needs - medical: Not on file  . Transportation needs - non-medical: Not on file  Occupational History  . Not on file  Tobacco Use  . Smoking status: Never Smoker  Substance and Sexual Activity  . Alcohol use: Yes  . Drug use: Yes    Types: Marijuana  . Sexual activity: Yes  Other Topics Concern  . Not on file  Social History Narrative  . Not on file          Mardella LaymanHagler, Christian Filippini, MD 10/29/17 1455

## 2017-10-29 NOTE — ED Triage Notes (Signed)
Per pt he thinks he has STD, per pt he is having burning and discomfort, per pt no abnormal discharge

## 2017-10-29 NOTE — Discharge Instructions (Addendum)
You have been given the following medications today for treatment of suspected gonorrhea and/or chlamydia: ° °cefTRIAXone (ROCEPHIN) injection 250 mg °azithromycin (ZITHROMAX) tablet 1,000 mg ° °Even though we have treated you today, we have sent testing for sexually transmitted infections. We will notify you of any positive results once they are received. If required, we will prescribe any medications you might need. ° °

## 2017-10-31 LAB — URINE CYTOLOGY ANCILLARY ONLY
Chlamydia: NEGATIVE
Neisseria Gonorrhea: NEGATIVE
Trichomonas: NEGATIVE

## 2017-12-11 ENCOUNTER — Emergency Department (HOSPITAL_BASED_OUTPATIENT_CLINIC_OR_DEPARTMENT_OTHER)
Admission: EM | Admit: 2017-12-11 | Discharge: 2017-12-11 | Disposition: A | Discharge status: Home / Self Care | Payer: BLUE CROSS/BLUE SHIELD | Attending: Emergency Medicine | Admitting: Emergency Medicine

## 2017-12-11 ENCOUNTER — Emergency Department (HOSPITAL_BASED_OUTPATIENT_CLINIC_OR_DEPARTMENT_OTHER): Payer: BLUE CROSS/BLUE SHIELD

## 2017-12-11 ENCOUNTER — Other Ambulatory Visit: Payer: Self-pay

## 2017-12-11 ENCOUNTER — Encounter (HOSPITAL_BASED_OUTPATIENT_CLINIC_OR_DEPARTMENT_OTHER): Payer: Self-pay | Admitting: Emergency Medicine

## 2017-12-11 DIAGNOSIS — R002 Palpitations: Secondary | ICD-10-CM | POA: Diagnosis not present

## 2017-12-11 DIAGNOSIS — R079 Chest pain, unspecified: Secondary | ICD-10-CM | POA: Diagnosis present

## 2017-12-11 DIAGNOSIS — E119 Type 2 diabetes mellitus without complications: Secondary | ICD-10-CM | POA: Insufficient documentation

## 2017-12-11 DIAGNOSIS — Z79899 Other long term (current) drug therapy: Secondary | ICD-10-CM | POA: Diagnosis not present

## 2017-12-11 DIAGNOSIS — R072 Precordial pain: Secondary | ICD-10-CM | POA: Diagnosis not present

## 2017-12-11 HISTORY — DX: Type 2 diabetes mellitus without complications: E11.9

## 2017-12-11 LAB — BASIC METABOLIC PANEL
ANION GAP: 9 (ref 5–15)
BUN: 12 mg/dL (ref 6–20)
CHLORIDE: 102 mmol/L (ref 101–111)
CO2: 28 mmol/L (ref 22–32)
CREATININE: 0.92 mg/dL (ref 0.61–1.24)
Calcium: 9.4 mg/dL (ref 8.9–10.3)
GFR calc non Af Amer: >60 mL/min (ref >60)
Glucose, Bld: 112 mg/dL — ABNORMAL HIGH (ref 65–99)
POTASSIUM: 3.7 mmol/L (ref 3.5–5.1)
SODIUM: 139 mmol/L (ref 135–145)

## 2017-12-11 LAB — CBC
HCT: 44.2 % (ref 39.0–52.0)
HEMOGLOBIN: 14.9 g/dL (ref 13.0–17.0)
MCH: 30.3 pg (ref 26.0–34.0)
MCHC: 33.7 g/dL (ref 30.0–36.0)
MCV: 90 fL (ref 78.0–100.0)
PLATELETS: 264 10*3/uL (ref 150–400)
RBC: 4.91 MIL/uL (ref 4.22–5.81)
RDW: 13.1 % (ref 11.5–15.5)
WBC: 6.6 10*3/uL (ref 4.0–10.5)

## 2017-12-11 LAB — D-DIMER, QUANTITATIVE: D-Dimer, Quant: <0.27 ug/mL-FEU (ref 0.00–0.50)

## 2017-12-11 LAB — TROPONIN I: Troponin I: <0.03 ng/mL (ref <0.03)

## 2017-12-11 NOTE — ED Provider Notes (Signed)
[facility] EMERGENCY DEPARTMENT Provider Note   CSN: [id number] Arrival date & time: 12/11/17  0935     History   Chief Complaint Chief Complaint  Patient presents with  . Chest Pain    HPI Christian Aguirre is a 57 y.o. male with history of rheumatic fever, hypertension, hyperlipidemia, diabetes on metformin, daily EtOH use, marijuana use and remote history of tobacco use here for evaluation of left-sided rib cage stabbing pain, intermittent that radiates up to his left armpit and left anterior shoulder for 3 days. Associated symptoms include sweating, palpitations described as my heart is fluttering, intermittent dizziness. States the symptoms are random and happen while he is at rest. Not exertional non pleuritic stabbing chest pain. Has felt his heart fluttering while in the emergency department and on the cardiac monitor. Reports remote history of similar heart fluttering several years ago that worsened with cigarette use and caffeine use. He stopped smoking cigarettes close to 10 years ago and tries to avoid caffeine because he knows these things make his heart fluttering worse. Last time he smoked marijuana was 3 days ago. He does drink a lot of  but denies any other caffeine intake. Drinks approximately 16 ounces of water a day.   No known h/o heart disease, MI, arrhythmias, DVT/PE. No leg swelling, orthopnea, PND, cough, recent illness, acid reflux, dysphagia, abdominal pain. No intervention PTA. No alleviating or aggravating factors.  HPI  Past Medical History:  Diagnosis Date  . Diabetes mellitus without complication (HCC)   . ETOH abuse   . Hypertension     There are no active problems to display for this patient.   History reviewed. No pertinent surgical history.     Home Medications    Prior to Admission medications   Medication Sig Start Date End Date Taking? Authorizing Provider  amLODipine (NORVASC) 10 MG tablet Take 10 mg by mouth daily.   Yes  [provider]  lisinopril (PRINIVIL,ZESTRIL) 20 MG tablet Take 20 mg by mouth daily.   Yes [provider]  metoprolol succinate (TOPROL-XL) 50 MG 24 hr tablet Take 50 mg by mouth daily. Take with or immediately following a meal.   Yes [provider]  simvastatin (ZOCOR) 10 MG tablet Take 10 mg by mouth daily.   Yes [provider]  METFORMIN HCL PO Take by mouth.    [provider]  oxyCODONE-acetaminophen (PERCOCET/ROXICET) 5-325 MG tablet Take 1-2 tablets by mouth every 6 (six) hours as needed for severe pain. 07/22/17   Renne Crigler, PA-C  penicillin v potassium (VEETID) 500 MG tablet Take 1 tablet (500 mg total) by mouth 3 (three) times daily. 07/22/17   Renne Crigler, PA-C    Family History History reviewed. No pertinent family history.  Social History Social History   Tobacco Use  . Smoking status: Never Smoker  . Smokeless tobacco: Never Used  Substance Use Topics  . Alcohol use: Yes  . Drug use: Yes    Types: Marijuana     Allergies   Patient has no known allergies.   Review of Systems Review of Systems  Constitutional: Positive for diaphoresis.  Cardiovascular: Positive for chest pain and palpitations.  Neurological: Positive for dizziness.  All other systems reviewed and are negative.    Physical Exam Updated Vital Signs BP (!) 146/95   Pulse 74   Temp 98.3 F (36.8 C) (Oral)   Resp 20   Ht 6' (1.829 m)   Wt 113.4 kg (250 lb)  SpO2 100%   BMI 33.91 kg/m   Physical Exam  Constitutional: He appears well-developed and well-nourished.  NAD. Non toxic.   HENT:  Head: Normocephalic and atraumatic.  Nose: Nose normal.  Moist mucous membranes. Tonsils and oropharynx normal  Eyes: Conjunctivae, EOM and lids are normal.  Neck: Trachea normal and normal range of motion.  Neck is supple Trachea midline No cervical adenopathy  Cardiovascular: Normal rate, regular rhythm, S1 normal, S2 normal and normal  heart sounds.  Pulses:      Carotid pulses are 2+ on the right side, and 2+ on the left side.      Radial pulses are 2+ on the right side, and 2+ on the left side.       Dorsalis pedis pulses are 2+ on the right side, and 2+ on the left side.  RRR. No orthopnea. No LE edema or calf tenderness.   Pulmonary/Chest: Effort normal and breath sounds normal. No respiratory distress. He has no decreased breath sounds. He has no rhonchi.  No reproducible chest wall tenderness. CP not reproducible with AROM of upper extremities. No rales or wheezing.  Abdominal: Soft. Bowel sounds are normal. There is no tenderness.  No epigastric tenderness. No distention.   Neurological: He is alert. GCS eye subscore is 4. GCS verbal subscore is 5. GCS motor subscore is 6.  Skin: Skin is warm and dry. Capillary refill takes less than 2 seconds.  No rash to chest wall  Psychiatric: He has a normal mood and affect. His speech is normal and behavior is normal. Judgment and thought content normal. Cognition and memory are normal.     ED Treatments / Results  Labs (all labs ordered are listed, but only abnormal results are displayed) Labs Reviewed  BASIC METABOLIC PANEL - Abnormal; Notable for the following components:      Result Value   Glucose, Bld 112 (*)    All other components within normal limits  CBC  TROPONIN I  D-DIMER, QUANTITATIVE (NOT AT [facility])  TROPONIN I    EKG  EKG Interpretation  Date/Time:  Tuesday December 11 2017 10:44:08 EST Ventricular Rate:  70 PR Interval:    QRS Duration: 104 QT Interval:  411 QTC Calculation: 444 R Axis:   15 Text Interpretation:  Sinus rhythm no acute ST/T changes no significant change since earlier in the day Confirmed by Pricilla LovelessGoldston, Scott [phone]([id number]) on 12/11/2017 11:51:47 AM       Radiology Dg Chest 2 View  Result Date: 12/11/2017 CLINICAL DATA:  Chest pain, shortness of breath, diaphoresis, and dizziness for several days. EXAM: CHEST  2 VIEW COMPARISON:   None. FINDINGS: The cardiomediastinal silhouette is within normal limits. The lungs are well inflated and clear. There is no evidence of pleural effusion or pneumothorax. Thoracic spondylosis and mild dextroscoliosis are noted. IMPRESSION: No active cardiopulmonary disease. Electronically Signed   By: Sebastian AcheAllen  Grady M.D.   On: 12/11/2017 10:15    Procedures Procedures (including critical care time)  Medications Ordered in ED Medications - No data to display   Initial Impression / Assessment and Plan / ED Course  I have reviewed the triage vital signs and the nursing notes.  Pertinent labs & imaging results that were available during my care of the patient were reviewed by me and considered in my medical decision making (see chart for details).     Pt is a 57 y.o. male presents with palpitations and left lateral rib cage pain described as sharp, intermittent  Non exertional, positional or pleuritic.  Pertinent risk factors include HTN, hypercholesterolemia, DM, obesity.  However symptomatology sounds atypical. On exam VS are wnl. Cardiovascular and pulmonary exam benign. CXR, EKG, troponin x 2 within normal limits.  CBC and BMP unremarkable.  D-dimer negative. Heart score = 3.  Not orthostatic in ED. Has had intermittent "heart fluttering in ED" however no arrhythmias on cardiac monitor noted.. Patient has ambulated and tolerated PO in ED. Given symptoms, reassuring ED work up, low risk HEART score patient will be discharged with recommendation to follow up with PCP and cardiologist in regards to today's hospital visit. ED return preacutions given. Pt appears reliable for follow up and is agreeable to discharge. Pt discussed with SP. Encouraged to cut back on alcohol intake, tea with caffeine and marijuana use as these may be contributing to palpitations. No signs of anemia. No hypoglycemia. Pt likely need thyroid panel, holter monitor and cardiac work up as outpatient basis given risk factors or heart  disease.   Final Clinical Impressions(s) / ED Diagnoses   Final diagnoses:  Precordial pain  Palpitations    ED Discharge Orders    None        Liberty Handy, PA-C 12/11/17 1400    Pricilla Loveless, MD 12/11/17 1556

## 2017-12-11 NOTE — ED Notes (Signed)
Pt is ambulatory to the bathroom with no difficulty.

## 2017-12-11 NOTE — ED Notes (Signed)
He wants to know how much longer before he can leave.

## 2017-12-11 NOTE — ED Notes (Signed)
Plan to draw 2nd Troponin at 1pm.

## 2017-12-11 NOTE — ED Triage Notes (Signed)
Patient states that he has had numbness and tingling to his left arm with chest pain and SOB x the last 2 -3 days

## 2017-12-11 NOTE — Discharge Instructions (Signed)
Some of your symptoms and palpitations may be from on-call use, marijuana use or caffeine intake in tea. Stay well-hydrated.  Your workup including lab work, heart enzymes, chest x-ray, EKG were all reassuring today. As we discussed you have some risk factors of heart disease and I recommend he follow up with a cardiologist in the next week or so for continued outpatient workup and evaluation of your heart function. They may recommend a Holter monitor, stress tests or ultrasound of your heart. They may also add thyroid panel. Return to the emergency department if chest pain becomes exertional, you have shortness of breath, fevers, chills, cough, nausea, vomiting, pass out.

## 2017-12-14 ENCOUNTER — Other Ambulatory Visit: Payer: Self-pay

## 2017-12-17 ENCOUNTER — Ambulatory Visit (INDEPENDENT_AMBULATORY_CARE_PROVIDER_SITE_OTHER): Payer: BLUE CROSS/BLUE SHIELD | Admitting: Cardiology

## 2017-12-17 ENCOUNTER — Encounter: Payer: Self-pay | Admitting: Cardiology

## 2017-12-17 ENCOUNTER — Other Ambulatory Visit: Payer: Self-pay

## 2017-12-17 ENCOUNTER — Telehealth: Payer: Self-pay

## 2017-12-17 VITALS — BP 142/78 | HR 76 | Ht 73.0 in | Wt 241.4 lb

## 2017-12-17 DIAGNOSIS — Z87891 Personal history of nicotine dependence: Secondary | ICD-10-CM | POA: Diagnosis not present

## 2017-12-17 DIAGNOSIS — R0789 Other chest pain: Secondary | ICD-10-CM | POA: Insufficient documentation

## 2017-12-17 DIAGNOSIS — E782 Mixed hyperlipidemia: Secondary | ICD-10-CM | POA: Diagnosis not present

## 2017-12-17 DIAGNOSIS — I10 Essential (primary) hypertension: Secondary | ICD-10-CM

## 2017-12-17 DIAGNOSIS — E1121 Type 2 diabetes mellitus with diabetic nephropathy: Secondary | ICD-10-CM | POA: Diagnosis not present

## 2017-12-17 MED ORDER — ALPRAZOLAM 0.5 MG PO TABS: 0.2500 mg | ORAL_TABLET | Freq: Every evening | ORAL | 0 refills | Status: AC | PRN

## 2017-12-17 MED ORDER — NITROGLYCERIN 0.4 MG SL SUBL: 0.4000 mg | SUBLINGUAL_TABLET | SUBLINGUAL | 6 refills | Status: AC | PRN

## 2017-12-17 MED ORDER — ALPRAZOLAM 0.5 MG PO TABS
0.5000 mg | ORAL_TABLET | Freq: Every evening | ORAL | 0 refills | Status: DC | PRN
Start: 2017-12-17 — End: 2017-12-17

## 2017-12-17 MED ORDER — ASPIRIN EC 81 MG PO TBEC: 162.0000 mg | DELAYED_RELEASE_TABLET | Freq: Every day | ORAL | 3 refills | Status: DC

## 2017-12-17 NOTE — Progress Notes (Signed)
Cardiology Office Note:    Date:  12/17/2017   ID:  Christian Aguirre, DOB [id number], MRN [id number]  PCP:  Patient, No Pcp Per  Cardiologist:  Garwin Brothers, MD   Referring MD: No ref. provider found    ASSESSMENT:    1. Essential hypertension   2. Controlled type 2 diabetes mellitus with diabetic nephropathy, without long-term current use of insulin (HCC)   3. Chest pain, atypical   4. Mixed hyperlipidemia    PLAN:    In order of problems listed above:  1. Primary prevention stressed with the patient.  Importance of compliance with diet and medications stressed and he vocalized understanding.  He needs to take coated aspirin on a regular basis and he is fine with it.  I told him to check with his primary care physician about this to see if there is any contraindication.  He has taken it in the past without any problems.  Diet was discussed for dyslipidemia and essential hypertension and lifestyle modification was stressed.  His blood pressure is stable. 2. Sublingual nitroglycerin prescription was sent, its protocol and 911 protocol explained and the patient vocalized understanding questions were answered to the patient's satisfaction 3. I mentioned to the patient that his chest pain is very atypical however in view of multiple risk factors he will undergo exercise stress echo.  He will be seen in follow-up appointment in a month or earlier if he has any concerns.  He knows to go to the nearest emergency room for any significant concerns.   Medication Adjustments/Labs and Tests Ordered: Current medicines are reviewed at length with the patient today.  Concerns regarding medicines are outlined above.  Orders Placed This Encounter  Procedures  . ECHOCARDIOGRAM STRESS TEST   Meds ordered this encounter  Medications  . aspirin EC 81 MG tablet    Sig: Take 2 tablets (162 mg total) by mouth daily.    Dispense:  90 tablet    Refill:  3  . nitroGLYCERIN (NITROSTAT) 0.4 MG SL  tablet    Sig: Place 1 tablet (0.4 mg total) under the tongue every 5 (five) minutes as needed.    Dispense:  11 tablet    Refill:  6     History of Present Illness:    Christian Aguirre is a 57 y.o. male who is being seen today for the evaluation of chest pain at the request of the emergency room.  Patient is a pleasant 57 year old male.  He has past medical history of essential hypertension, dyslipidemia and diabetes mellitus.  He is ex-smoker and quit 8 years ago.  He mentions to me he has tingling sensation in the left arm.  This is not related to exertion.  No chest pain orthopnea or PND.  He went to the emergency room and is very concerned of his symptoms.  At the time of my evaluation, the patient is alert awake oriented and in no distress.  His fiance accompanies him.  He leads a very sedentary lifestyle.  He mentions to me that he does not exercise regularly.  When asked him of sexual activity he mentions to me that sexual activity does not reproduce any of the symptoms.  No syncope.  Past Medical History:  Diagnosis Date  . Diabetes mellitus without complication (HCC)   . ETOH abuse   . Hypertension     History reviewed. No pertinent surgical history.  Current Medications: Current Meds  Medication Sig  . amLODipine (NORVASC) 10  MG tablet Take 10 mg by mouth daily.  . diclofenac (VOLTAREN) 50 MG EC tablet Take by mouth.  Marland Kitchen. lisinopril (PRINIVIL,ZESTRIL) 20 MG tablet Take 20 mg by mouth daily.  . metFORMIN (GLUCOPHAGE) 500 MG tablet Take 500 mg by mouth 2 (two) times daily.  . metoprolol succinate (TOPROL-XL) 50 MG 24 hr tablet Take 50 mg by mouth daily. Take with or immediately following a meal.  . simvastatin (ZOCOR) 10 MG tablet Take 10 mg by mouth daily.  . [DISCONTINUED] METFORMIN HCL PO Take by mouth.     Allergies:   Patient has no known allergies.   Social History   Socioeconomic History  . Marital status: Single    Spouse name: None  . Number of children: None    . Years of education: None  . Highest education level: None  Social Needs  . Financial resource strain: None  . Food insecurity - worry: None  . Food insecurity - inability: None  . Transportation needs - medical: None  . Transportation needs - non-medical: None  Occupational History  . None  Tobacco Use  . Smoking status: Never Smoker  . Smokeless tobacco: Never Used  Substance and Sexual Activity  . Alcohol use: Yes  . Drug use: Yes    Types: Marijuana  . Sexual activity: Yes  Other Topics Concern  . None  Social History Narrative  . None     Family History: The patient's family history is not on file.  ROS:   Please see the history of present illness.    All other systems reviewed and are negative.  EKGs/Labs/Other Studies Reviewed:    The following studies were reviewed today: I reviewed records from the emergency room extensively.  EKG reveals sinus rhythm and nonspecific ST changes.     Recent Labs: 12/11/2017: BUN 12; Creatinine, Ser 0.92; Hemoglobin 14.9; Platelets 264; Potassium 3.7; Sodium 139  Recent Lipid Panel No results found for: CHOL, TRIG, HDL, CHOLHDL, VLDL, LDLCALC, LDLDIRECT  Physical Exam:    VS:  BP (!) 142/78 (BP Location: Left Arm, Patient Position: Sitting, Cuff Size: Normal)   Pulse 76   Ht 6\' 1"  (1.854 m)   Wt 241 lb 6.4 oz (109.5 kg)   SpO2 98%   BMI 31.85 kg/m     Wt Readings from Last 3 Encounters:  12/17/17 241 lb 6.4 oz (109.5 kg)  12/11/17 250 lb (113.4 kg)  07/22/17 250 lb (113.4 kg)     GEN: Patient is in no acute distress HEENT: Normal NECK: No JVD; No carotid bruits LYMPHATICS: No lymphadenopathy CARDIAC: S1 S2 regular, 2/6 systolic murmur at the apex. RESPIRATORY:  Clear to auscultation without rales, wheezing or rhonchi  ABDOMEN: Soft, non-tender, non-distended MUSCULOSKELETAL:  No edema; No deformity  SKIN: Warm and dry NEUROLOGIC:  Alert and oriented x 3 PSYCHIATRIC:  Normal affect    Signed, Garwin Brothersajan R  Shauntea Lok, MD  12/17/2017 9:11 AM    [facility]

## 2017-12-17 NOTE — Patient Instructions (Signed)
Medication Instructions:  Your physician has recommended you make the following change in your medication:  START 162 mg enteric coated aspirin daily START Nitroglycerin 0.4 mg sublingual (under your tongue) as needed for chest pain. If experiencing chest pain, stop what you are doing and sit down. Take 1 nitroglycerin and wait 5 minutes. If chest pain continues, take another nitroglycerin and wait 5 minutes. If chest pain does not subside, take 1 more nitroglycerin and dial 911. You make take a total of 3 nitroglycerin in a 15 minute time frame.  Labwork: None  Testing/Procedures: Your physician has requested that you have a stress echocardiogram. For further information please visit [email or web address] Please follow instruction sheet as given.  Follow-Up: Your physician recommends that you schedule a follow-up appointment in: 1 month  Any Other Special Instructions Will Be Listed Below (If Applicable).     If you need a refill on your cardiac medications before your next appointment, please call your pharmacy.   [facility]  Garey Ham, RN, BSN  Nitroglycerin sublingual tablets What is this medicine? NITROGLYCERIN (nye troe GLI ser in) is a type of vasodilator. It relaxes blood vessels, increasing the blood and oxygen supply to your heart. This medicine is used to relieve chest pain caused by angina. It is also used to prevent chest pain before activities like climbing stairs, going outdoors in cold weather, or sexual activity. This medicine may be used for other purposes; ask your health care provider or pharmacist if you have questions. COMMON BRAND NAME(S): Nitroquick, Nitrostat, Nitrotab What should I tell my health care provider before I take this medicine? They need to know if you have any of these conditions: -anemia -head injury, recent stroke, or bleeding in the brain -liver disease -previous heart attack -an unusual or allergic reaction to nitroglycerin, other  medicines, foods, dyes, or preservatives -pregnant or trying to get pregnant -breast-feeding How should I use this medicine? Take this medicine by mouth as needed. At the first sign of an angina attack (chest pain or tightness) place one tablet under your tongue. You can also take this medicine 5 to 10 minutes before an event likely to produce chest pain. Follow the directions on the prescription label. Let the tablet dissolve under the tongue. Do not swallow whole. Replace the dose if you accidentally swallow it. It will help if your mouth is not dry. Saliva around the tablet will help it to dissolve more quickly. Do not eat or drink, smoke or chew tobacco while a tablet is dissolving. If you are not better within 5 minutes after taking ONE dose of nitroglycerin, call 9-1-1 immediately to seek emergency medical care. Do not take more than 3 nitroglycerin tablets over 15 minutes. If you take this medicine often to relieve symptoms of angina, your doctor or health care professional may provide you with different instructions to manage your symptoms. If symptoms do not go away after following these instructions, it is important to call 9-1-1 immediately. Do not take more than 3 nitroglycerin tablets over 15 minutes. Talk to your pediatrician regarding the use of this medicine in children. Special care may be needed. Overdosage: If you think you have taken too much of this medicine contact a poison control center or emergency room at once. NOTE: This medicine is only for you. Do not share this medicine with others. What if I miss a dose? This does not apply. This medicine is only used as needed. What may interact with this medicine? Do not  take this medicine with any of the following medications: -certain migraine medicines like ergotamine and dihydroergotamine (DHE) -medicines used to treat erectile dysfunction like sildenafil, tadalafil, and vardenafil -riociguat This medicine may also interact with  the following medications: -alteplase -aspirin -heparin -medicines for high blood pressure -medicines for mental depression -other medicines used to treat angina -phenothiazines like chlorpromazine, mesoridazine, prochlorperazine, thioridazine This list may not describe all possible interactions. Give your health care provider a list of all the medicines, herbs, non-prescription drugs, or dietary supplements you use. Also tell them if you smoke, drink alcohol, or use illegal drugs. Some items may interact with your medicine. What should I watch for while using this medicine? Tell your doctor or health care professional if you feel your medicine is no longer working. Keep this medicine with you at all times. Sit or lie down when you take your medicine to prevent falling if you feel dizzy or faint after using it. Try to remain calm. This will help you to feel better faster. If you feel dizzy, take several deep breaths and lie down with your feet propped up, or bend forward with your head resting between your knees. You may get drowsy or dizzy. Do not drive, use machinery, or do anything that needs mental alertness until you know how this drug affects you. Do not stand or sit up quickly, especially if you are an older patient. This reduces the risk of dizzy or fainting spells. Alcohol can make you more drowsy and dizzy. Avoid alcoholic drinks. Do not treat yourself for coughs, colds, or pain while you are taking this medicine without asking your doctor or health care professional for advice. Some ingredients may increase your blood pressure. What side effects may I notice from receiving this medicine? Side effects that you should report to your doctor or health care professional as soon as possible: -blurred vision -dry mouth -skin rash -sweating -the feeling of extreme pressure in the head -unusually weak or tired Side effects that usually do not require medical attention (report to your doctor  or health care professional if they continue or are bothersome): -flushing of the face or neck -headache -irregular heartbeat, palpitations -nausea, vomiting This list may not describe all possible side effects. Call your doctor for medical advice about side effects. You may report side effects to FDA at 1-800-FDA-1088. Where should I keep my medicine? Keep out of the reach of children. Store at room temperature between 20 and 25 degrees C (68 and 77 degrees F). Store in Retail buyer. Protect from light and moisture. Keep tightly closed. Throw away any unused medicine after the expiration date. NOTE: This sheet is a summary. It may not cover all possible information. If you have questions about this medicine, talk to your doctor, pharmacist, or health care provider.  2018 Elsevier/Gold Standard (2013-08-14 17:57:36)  Cardiopulmonary Exercise Stress Test Cardiopulmonary exercise testing (CPET) is a test that checks how your heart and lungs react to exercise. This is called your exercise capacity. During this test, you will walk or run on a treadmill or pedal on a stationary bike while tests are done on your heart and lungs. You may have this test to:  See why you are short of breath.  Check for exercise intolerance.  See how your lungs work.  See how your heart works.  Check for how you are responding to a heart or lung rehabilitation program.  See if you have a heart or lung problem.  See if you are  healthy enough to have surgery.  What happens before the procedure?  Follow instructions from your doctor about what you cannot eat or drink.  Ask your doctor about changing or stopping your normal medicines. This is important if you take diabetes medicines or blood thinners.  Wear loose, comfortable clothing and shoes.  If you use an inhaler, bring it with you to the test. What happens during the procedure?  A blood pressure cuff will be placed on your arm.  Several  stick-on patches (electrodes) will be placed on your chest. They will be attached to an electrocardiogram (EKG) machine.  A clip-on monitor that measures the amount of oxygen in your blood will be placed on your finger (pulse oximeter).  A clip will be placed on your nose and a mouthpiece will be placed in your mouth. This may be held in place with a headpiece. You will breathe through the mouthpiece during the test.  You will be asked to start exercising. You will be closely watched while you exercise.  The amount of effort for your exercise will be gradually increased.  During exercise, the test will measure: ? Your heart rate. ? Your heart rhythm. ? Your oxygen blood level. ? The amount of oxygen and carbon dioxide that you breathe out.  The test will end when: ? You have finished the test. ? You have reached your maximum ability to exercise. ? You have chest or leg pain, dizziness, or shortness of breath. The procedure may vary among doctors and hospitals. What happens after the procedure?  Your blood pressure and EKG will be checked to watch how you recover from the test. This information is not intended to replace advice given to you by your health care provider. Make sure you discuss any questions you have with your health care provider. Document Released: 10/04/2009 Document Revised: 03/07/2016 Document Reviewed: 08/30/2015 Elsevier Interactive Patient Education  2018 [employer]

## 2017-12-17 NOTE — Progress Notes (Signed)
Upon discharging the patient it was observed that he was experiencing a great deal of anxiety. The patient appeared to have flat affect and was unsettled regarding his symptoms. Testing was explained to the patient in great detail. Dr. Tomie Chinaevankar spoke with the patient on two occasions in an effort to ease him. Per Dr. Tomie Chinaevankar the patient would benefit from a one time prescription fill of xanax 0.25 mg tablets. The patient left agreeable and satisfied with the plan of care.  Craige CottaAshley S Anderson, RN

## 2017-12-17 NOTE — Addendum Note (Signed)
Addended by: Craige CottaANDERSON, Damarius Karnes S on: 12/17/2017 10:36 AM   Modules accepted: Orders

## 2017-12-17 NOTE — Addendum Note (Signed)
Addended by: Craige CottaANDERSON, Tonda Wiederhold S on: 12/17/2017 09:18 AM   Modules accepted: Orders

## 2017-12-17 NOTE — Telephone Encounter (Signed)
Left voicemail informing of med change; xanax 0.25 mg. Pharmacy also made aware.

## 2017-12-17 NOTE — Addendum Note (Signed)
Addended by: Craige CottaANDERSON, ASHLEY S on: 12/17/2017 09:32 AM   Modules accepted: Orders

## 2017-12-27 ENCOUNTER — Telehealth (HOSPITAL_COMMUNITY): Payer: Self-pay | Admitting: *Deleted

## 2017-12-27 NOTE — Telephone Encounter (Signed)
Left message on voicemail per DPR in reference to upcoming appointment scheduled on 01/01/18 at 7:30 with detailed instructions given per Stress Test Requisition Sheet for the test. LM to arrive 30 minutes early, and that it is imperative to arrive on time for appointment to keep from having the test rescheduled. If you need to cancel or reschedule your appointment, please call the office within 24 hours of your appointment. Failure to do so may result in a cancellation of your appointment, and a $50 no show fee. Phone number given for call back for any questions. Daneil DolinSharon S Brooks

## 2017-12-31 ENCOUNTER — Ambulatory Visit (HOSPITAL_COMMUNITY): Payer: BLUE CROSS/BLUE SHIELD | Attending: Cardiology

## 2017-12-31 DIAGNOSIS — R0789 Other chest pain: Secondary | ICD-10-CM | POA: Diagnosis not present

## 2017-12-31 DIAGNOSIS — I10 Essential (primary) hypertension: Secondary | ICD-10-CM

## 2018-01-01 ENCOUNTER — Ambulatory Visit (HOSPITAL_COMMUNITY): Payer: BLUE CROSS/BLUE SHIELD

## 2018-01-01 ENCOUNTER — Ambulatory Visit (HOSPITAL_BASED_OUTPATIENT_CLINIC_OR_DEPARTMENT_OTHER): Payer: BLUE CROSS/BLUE SHIELD

## 2018-01-01 DIAGNOSIS — R0789 Other chest pain: Secondary | ICD-10-CM

## 2018-01-01 MED ORDER — PERFLUTREN LIPID MICROSPHERE
1.0000 mL | INTRAVENOUS | Status: AC | PRN
  Administered 2018-01-01 (×3): 1 mL via INTRAVENOUS

## 2018-01-14 ENCOUNTER — Ambulatory Visit: Payer: BLUE CROSS/BLUE SHIELD | Admitting: Cardiology

## 2018-08-20 ENCOUNTER — Emergency Department (HOSPITAL_BASED_OUTPATIENT_CLINIC_OR_DEPARTMENT_OTHER)
Admission: EM | Admit: 2018-08-20 | Discharge: 2018-08-20 | Disposition: A | Discharge status: Home / Self Care | Payer: BLUE CROSS/BLUE SHIELD | Attending: Emergency Medicine | Admitting: Emergency Medicine

## 2018-08-20 ENCOUNTER — Other Ambulatory Visit: Payer: Self-pay

## 2018-08-20 ENCOUNTER — Encounter (HOSPITAL_BASED_OUTPATIENT_CLINIC_OR_DEPARTMENT_OTHER): Payer: Self-pay | Admitting: Emergency Medicine

## 2018-08-20 ENCOUNTER — Emergency Department (HOSPITAL_BASED_OUTPATIENT_CLINIC_OR_DEPARTMENT_OTHER): Payer: BLUE CROSS/BLUE SHIELD

## 2018-08-20 DIAGNOSIS — R070 Pain in throat: Secondary | ICD-10-CM | POA: Diagnosis present

## 2018-08-20 DIAGNOSIS — I10 Essential (primary) hypertension: Secondary | ICD-10-CM | POA: Insufficient documentation

## 2018-08-20 DIAGNOSIS — Z7982 Long term (current) use of aspirin: Secondary | ICD-10-CM | POA: Diagnosis not present

## 2018-08-20 DIAGNOSIS — F1210 Cannabis abuse, uncomplicated: Secondary | ICD-10-CM | POA: Diagnosis not present

## 2018-08-20 DIAGNOSIS — Z79899 Other long term (current) drug therapy: Secondary | ICD-10-CM | POA: Insufficient documentation

## 2018-08-20 DIAGNOSIS — Z7984 Long term (current) use of oral hypoglycemic drugs: Secondary | ICD-10-CM | POA: Insufficient documentation

## 2018-08-20 DIAGNOSIS — E119 Type 2 diabetes mellitus without complications: Secondary | ICD-10-CM | POA: Insufficient documentation

## 2018-08-20 DIAGNOSIS — J020 Streptococcal pharyngitis: Secondary | ICD-10-CM | POA: Diagnosis not present

## 2018-08-20 LAB — CBC WITH DIFFERENTIAL/PLATELET
Abs Immature Granulocytes: 0.06 10*3/uL (ref 0.00–0.07)
BASOS PCT: 0 %
Basophils Absolute: 0 10*3/uL (ref 0.0–0.1)
EOS ABS: 0.2 10*3/uL (ref 0.0–0.5)
EOS PCT: 1 %
HCT: 44.4 % (ref 39.0–52.0)
Hemoglobin: 13.9 g/dL (ref 13.0–17.0)
IMMATURE GRANULOCYTES: 0 %
LYMPHS ABS: 2.3 10*3/uL (ref 0.7–4.0)
Lymphocytes Relative: 16 %
MCH: 29.3 pg (ref 26.0–34.0)
MCHC: 31.3 g/dL (ref 30.0–36.0)
MCV: 93.5 fL (ref 80.0–100.0)
MONO ABS: 1.8 10*3/uL — AB (ref 0.1–1.0)
Monocytes Relative: 13 %
Neutro Abs: 9.5 10*3/uL — ABNORMAL HIGH (ref 1.7–7.7)
Neutrophils Relative %: 70 %
PLATELETS: 236 10*3/uL (ref 150–400)
RBC: 4.75 MIL/uL (ref 4.22–5.81)
RDW: 13.1 % (ref 11.5–15.5)
WBC: 13.8 10*3/uL — ABNORMAL HIGH (ref 4.0–10.5)
nRBC: 0 % (ref 0.0–0.2)

## 2018-08-20 LAB — BASIC METABOLIC PANEL
ANION GAP: 13 (ref 5–15)
BUN: 14 mg/dL (ref 6–20)
CALCIUM: 9.2 mg/dL (ref 8.9–10.3)
CO2: 27 mmol/L (ref 22–32)
Chloride: 102 mmol/L (ref 98–111)
Creatinine, Ser: 1.18 mg/dL (ref 0.61–1.24)
GFR calc Af Amer: >60 mL/min (ref >60)
GFR calc non Af Amer: >60 mL/min (ref >60)
GLUCOSE: 157 mg/dL — AB (ref 70–99)
Potassium: 3.6 mmol/L (ref 3.5–5.1)
Sodium: 142 mmol/L (ref 135–145)

## 2018-08-20 LAB — GROUP A STREP BY PCR: GROUP A STREP BY PCR: DETECTED — AB

## 2018-08-20 MED ORDER — IOPAMIDOL (ISOVUE-300) INJECTION 61%
100.0000 mL | Freq: Once | INTRAVENOUS | Status: AC | PRN
  Administered 2018-08-20: 80 mL via INTRAVENOUS

## 2018-08-20 MED ORDER — KETOROLAC TROMETHAMINE 30 MG/ML IJ SOLN
15.0000 mg | Freq: Once | INTRAMUSCULAR | Status: AC
  Administered 2018-08-20: 15 mg via INTRAVENOUS
  Filled 2018-08-20: qty 1

## 2018-08-20 MED ORDER — HYDROCODONE-ACETAMINOPHEN 5-325 MG PO TABS
1.0000 | ORAL_TABLET | Freq: Once | ORAL | Status: AC
  Administered 2018-08-20: 1 via ORAL
  Filled 2018-08-20: qty 1

## 2018-08-20 MED ORDER — DEXAMETHASONE SODIUM PHOSPHATE 10 MG/ML IJ SOLN
10.0000 mg | Freq: Once | INTRAMUSCULAR | Status: AC
  Administered 2018-08-20: 10 mg via INTRAVENOUS
  Filled 2018-08-20: qty 1

## 2018-08-20 MED ORDER — CLINDAMYCIN PHOSPHATE 900 MG/50ML IV SOLN
900.0000 mg | Freq: Once | INTRAVENOUS | Status: AC
  Administered 2018-08-20: 900 mg via INTRAVENOUS
  Filled 2018-08-20: qty 50

## 2018-08-20 MED ORDER — TRAMADOL HCL 50 MG PO TABS: 50.0000 mg | ORAL_TABLET | Freq: Four times a day (QID) | ORAL | 0 refills | Status: DC | PRN

## 2018-08-20 MED ORDER — AMOXICILLIN 500 MG PO CAPS: 1000.0000 mg | ORAL_CAPSULE | Freq: Two times a day (BID) | ORAL | 0 refills | Status: DC

## 2018-08-20 MED ORDER — SODIUM CHLORIDE 0.9 % IV BOLUS
1000.0000 mL | Freq: Once | INTRAVENOUS | Status: AC
  Administered 2018-08-20: 1000 mL via INTRAVENOUS

## 2018-08-20 NOTE — ED Triage Notes (Signed)
Pt c/o 10/10 sore throat for the past few days, no fever or chills.

## 2018-08-20 NOTE — ED Provider Notes (Signed)
[facility] EMERGENCY DEPARTMENT Provider Note   CSN: [id number] Arrival date & time: 08/20/18  7829     History   Chief Complaint Chief Complaint  Patient presents with  . Sore Throat    HPI Christian Aguirre is a 57 y.o. male.  Patient presents with complaints of sore throat.  He reports that he started to have a sore throat 2 days ago and it has progressively worsened.  Patient reports that the pain is now severe, hurts to try and swallow.  He thinks he might of had a low-grade fever initially and has had some cough.     Past Medical History:  Diagnosis Date  . Diabetes mellitus without complication (HCC)   . ETOH abuse   . Hypertension     Patient Active Problem List   Diagnosis Date Noted  . Chest pain, atypical 12/17/2017  . Ex-smoker 12/17/2017  . Diabetes type 2, controlled (HCC) 12/08/2015  . Essential hypertension 12/08/2015  . Hyperlipidemia 12/08/2015    History reviewed. No pertinent surgical history.      Home Medications    Prior to Admission medications   Medication Sig Start Date End Date Taking? Authorizing Provider  ALPRAZolam Prudy Feeler) 0.5 MG tablet Take 0.5 tablets (0.25 mg total) by mouth at bedtime as needed for anxiety. 12/17/17   Revankar, Aundra Dubin, MD  amLODipine (NORVASC) 10 MG tablet Take 10 mg by mouth daily.    [provider]  amoxicillin (AMOXIL) 500 MG capsule Take 2 capsules (1,000 mg total) by mouth 2 (two) times daily. 08/20/18   Gilda Crease, MD  aspirin EC 81 MG tablet Take 2 tablets (162 mg total) by mouth daily. 12/17/17   Revankar, Aundra Dubin, MD  diclofenac (VOLTAREN) 50 MG EC tablet Take by mouth. 06/22/17   [provider]  lisinopril (PRINIVIL,ZESTRIL) 20 MG tablet Take 20 mg by mouth daily.    [provider]  metFORMIN (GLUCOPHAGE) 500 MG tablet Take 500 mg by mouth 2 (two) times daily. 11/05/17   [provider]  metoprolol succinate (TOPROL-XL) 50 MG 24 hr tablet Take  50 mg by mouth daily. Take with or immediately following a meal.    [provider]  nitroGLYCERIN (NITROSTAT) 0.4 MG SL tablet Place 1 tablet (0.4 mg total) under the tongue every 5 (five) minutes as needed. 12/17/17 03/17/18  Revankar, Aundra Dubin, MD  simvastatin (ZOCOR) 10 MG tablet Take 10 mg by mouth daily.    [provider]  traMADol (ULTRAM) 50 MG tablet Take 1 tablet (50 mg total) by mouth every 6 (six) hours as needed. 08/20/18   Gilda Crease, MD    Family History History reviewed. No pertinent family history.  Social History Social History   Tobacco Use  . Smoking status: Never Smoker  . Smokeless tobacco: Never Used  Substance Use Topics  . Alcohol use: Yes  . Drug use: Yes    Types: Marijuana     Allergies   Patient has no known allergies.   Review of Systems Review of Systems  Constitutional: Positive for fever.  HENT: Positive for sore throat.   Respiratory: Positive for cough.   All other systems reviewed and are negative.    Physical Exam Updated Vital Signs BP (!) 165/91 (BP Location: Right Arm)   Pulse 91   Temp 99 F (37.2 C) (Oral)   Resp 18   Ht 6' (1.829 m)   Wt 115.7 kg   SpO2 98%  BMI 34.58 kg/m   Physical Exam  Constitutional: He is oriented to person, place, and time. He appears well-developed and well-nourished. No distress.  HENT:  Head: Normocephalic and atraumatic.  Right Ear: Hearing normal.  Left Ear: Hearing normal.  Nose: Nose normal.  Mouth/Throat: Mucous membranes are normal. Posterior oropharyngeal edema and posterior oropharyngeal erythema present. Tonsils are 2+ on the right. Tonsils are 1+ on the left.  Eyes: Pupils are equal, round, and reactive to light. Conjunctivae and EOM are normal.  Neck: Normal range of motion. Neck supple.  Cardiovascular: Regular rhythm, S1 normal and S2 normal. Exam reveals no gallop and no friction rub.  No murmur heard. Pulmonary/Chest: Effort normal and breath  sounds normal. No respiratory distress. He exhibits no tenderness.  Abdominal: Soft. Normal appearance and bowel sounds are normal. There is no hepatosplenomegaly. There is no tenderness. There is no rebound, no guarding, no tenderness at McBurney's point and negative Murphy's sign. No hernia.  Musculoskeletal: Normal range of motion.  Neurological: He is alert and oriented to person, place, and time. He has normal strength. No cranial nerve deficit or sensory deficit. Coordination normal. GCS eye subscore is 4. GCS verbal subscore is 5. GCS motor subscore is 6.  Skin: Skin is warm, dry and intact. No rash noted. No cyanosis.  Psychiatric: He has a normal mood and affect. His speech is normal and behavior is normal. Thought content normal.  Nursing note and vitals reviewed.    ED Treatments / Results  Labs (all labs ordered are listed, but only abnormal results are displayed) Labs Reviewed  GROUP A STREP BY PCR - Abnormal; Notable for the following components:      Result Value   Group A Strep by PCR DETECTED (*)    All other components within normal limits  CBC WITH DIFFERENTIAL/PLATELET - Abnormal; Notable for the following components:   WBC 13.8 (*)    Neutro Abs 9.5 (*)    Monocytes Absolute 1.8 (*)    All other components within normal limits  BASIC METABOLIC PANEL - Abnormal; Notable for the following components:   Glucose, Bld 157 (*)    All other components within normal limits    EKG None  Radiology Ct Soft Tissue Neck W Contrast  Result Date: 08/20/2018 CLINICAL DATA:  Throw pain since Saturday. EXAM: CT NECK WITH CONTRAST TECHNIQUE: Multidetector CT imaging of the neck was performed using the standard protocol following the bolus administration of intravenous contrast. CONTRAST:  80mL ISOVUE-300 IOPAMIDOL (ISOVUE-300) INJECTION 61% COMPARISON:  None. FINDINGS: PHARYNX AND LARYNX: --Nasopharynx: Marked enlargement of the adenoid tonsils. --Oral cavity and oropharynx:  Palatine tonsils are enlarged. No peritonsillar fluid collection. Normal lingual tonsils. --Hypopharynx: Normal vallecula and pyriform sinuses. --Larynx: Normal epiglottis and pre-epiglottic space. Normal aryepiglottic and vocal folds. --Retropharyngeal space: No abscess, effusion or lymphadenopathy. SALIVARY GLANDS: --Parotid: No mass lesion or inflammation. No sialolithiasis or ductal dilatation. --Submandibular: Symmetric without inflammation. No sialolithiasis or ductal dilatation. --Sublingual: Normal. No ranula or other visible lesion of the base of tongue and floor of mouth. THYROID: Normal. LYMPH NODES: Enlarged bilateral lymph nodes, measuring up to 23 mm at right level 2A and 15 mm at left level 2A. VASCULAR: Major cervical vessels are patent. LIMITED INTRACRANIAL: Normal. VISUALIZED ORBITS: Normal. MASTOIDS AND VISUALIZED PARANASAL SINUSES: No fluid levels or advanced mucosal thickening. No mastoid effusion. SKELETON: No bony spinal canal stenosis. No lytic or blastic lesions. UPPER CHEST: Clear. OTHER: None. IMPRESSION: Enlarged adenoid and palatine tonsils with multiple  enlarged bilateral cervical lymph nodes. The findings are most consistent with acute tonsillopharyngitis and reactive lymphadenopathy. If these findings do not resolve with treatment, that would raise the possibility of a lymphoproliferative disorder. Electronically Signed   By: Deatra Robinson M.D.   On: 08/20/2018 06:45    Procedures Procedures (including critical care time)  Medications Ordered in ED Medications  dexamethasone (DECADRON) injection 10 mg (has no administration in time range)  ketorolac (TORADOL) 30 MG/ML injection 15 mg (has no administration in time range)  HYDROcodone-acetaminophen (NORCO/VICODIN) 5-325 MG per tablet 1 tablet (has no administration in time range)  sodium chloride 0.9 % bolus 1,000 mL ( Intravenous Stopped 08/20/18 0620)  clindamycin (CLEOCIN) IVPB 900 mg (0 mg Intravenous Stopped 08/20/18  0621)  iopamidol (ISOVUE-300) 61 % injection 100 mL (80 mLs Intravenous Contrast Given 08/20/18 0622)     Initial Impression / Assessment and Plan / ED Course  I have reviewed the triage vital signs and the nursing notes.  Pertinent labs & imaging results that were available during my care of the patient were reviewed by me and considered in my medical decision making (see chart for details).     Presented to the ER for evaluation of severe sore throat.  Examination revealed oral pharyngeal edema, tonsillar enlargement with asymmetry, right greater than left.  This was suspicious for possible abscess.  Patient initiated on IV clindamycin empirically.  He was positive for strep.  CT neck was performed and does not show abscess.  Swelling is secondary to multiple enlarged lymph nodes.  Patient provided analgesia here in the ER and a single dose of Decadron for tonsillar swelling.  As he does not have an abscess, can discharge him on oral amoxicillin, as it is less likely to cause C. difficile colitis.  Final Clinical Impressions(s) / ED Diagnoses   Final diagnoses:  Strep throat    ED Discharge Orders         Ordered    amoxicillin (AMOXIL) 500 MG capsule  2 times daily     08/20/18 0654    traMADol (ULTRAM) 50 MG tablet  Every 6 hours PRN     08/20/18 0654           Gilda Crease, MD 08/20/18 [phone]

## 2018-08-20 NOTE — ED Notes (Signed)
Patient transported to CT 

## 2019-03-24 ENCOUNTER — Other Ambulatory Visit: Payer: Self-pay

## 2019-03-24 ENCOUNTER — Emergency Department (HOSPITAL_BASED_OUTPATIENT_CLINIC_OR_DEPARTMENT_OTHER)
Admission: EM | Admit: 2019-03-24 | Discharge: 2019-03-24 | Disposition: A | Discharge status: Home / Self Care | Payer: BLUE CROSS/BLUE SHIELD | Attending: Emergency Medicine | Admitting: Emergency Medicine

## 2019-03-24 ENCOUNTER — Encounter (HOSPITAL_BASED_OUTPATIENT_CLINIC_OR_DEPARTMENT_OTHER): Payer: Self-pay | Admitting: Emergency Medicine

## 2019-03-24 DIAGNOSIS — I10 Essential (primary) hypertension: Secondary | ICD-10-CM | POA: Diagnosis not present

## 2019-03-24 DIAGNOSIS — H6122 Impacted cerumen, left ear: Secondary | ICD-10-CM | POA: Diagnosis not present

## 2019-03-24 DIAGNOSIS — Z7984 Long term (current) use of oral hypoglycemic drugs: Secondary | ICD-10-CM | POA: Diagnosis not present

## 2019-03-24 DIAGNOSIS — Z7982 Long term (current) use of aspirin: Secondary | ICD-10-CM | POA: Diagnosis not present

## 2019-03-24 DIAGNOSIS — E119 Type 2 diabetes mellitus without complications: Secondary | ICD-10-CM | POA: Insufficient documentation

## 2019-03-24 DIAGNOSIS — H938X2 Other specified disorders of left ear: Secondary | ICD-10-CM | POA: Diagnosis present

## 2019-03-24 DIAGNOSIS — Z79899 Other long term (current) drug therapy: Secondary | ICD-10-CM | POA: Diagnosis not present

## 2019-03-24 NOTE — ED Notes (Signed)
ED Provider at bedside. 

## 2019-03-24 NOTE — ED Provider Notes (Signed)
[facility] EMERGENCY DEPARTMENT Provider Note   CSN: [id number] Arrival date & time: 03/24/19  0017    History   Chief Complaint Chief Complaint  Patient presents with  . Ear Problem    HPI Christian Aguirre is a 58 y.o. male.     Patient with complaint of wax buildup in left ear.  Patient seen in 2017 for similar problem.  Patient also states there was ear popping in the left ear overnight.  States this decreased hearing.  Hears fine now the right ear.  Patient is been using Debrox.  Denies any difficulty swallowing.  Any ear ringing or dizziness.     Past Medical History:  Diagnosis Date  . Diabetes mellitus without complication (HCC)   . ETOH abuse   . Hypertension     Patient Active Problem List   Diagnosis Date Noted  . Chest pain, atypical 12/17/2017  . Ex-smoker 12/17/2017  . Diabetes type 2, controlled (HCC) 12/08/2015  . Essential hypertension 12/08/2015  . Hyperlipidemia 12/08/2015    History reviewed. No pertinent surgical history.      Home Medications    Prior to Admission medications   Medication Sig Start Date End Date Taking? Authorizing Provider  ALPRAZolam Prudy Feeler) 0.5 MG tablet Take 0.5 tablets (0.25 mg total) by mouth at bedtime as needed for anxiety. 12/17/17   Revankar, Aundra Dubin, MD  amLODipine (NORVASC) 10 MG tablet Take 10 mg by mouth daily.    [provider]  amoxicillin (AMOXIL) 500 MG capsule Take 2 capsules (1,000 mg total) by mouth 2 (two) times daily. 08/20/18   Gilda Crease, MD  aspirin EC 81 MG tablet Take 2 tablets (162 mg total) by mouth daily. 12/17/17   Revankar, Aundra Dubin, MD  diclofenac (VOLTAREN) 50 MG EC tablet Take by mouth. 06/22/17   [provider]  lisinopril (PRINIVIL,ZESTRIL) 20 MG tablet Take 20 mg by mouth daily.    [provider]  metFORMIN (GLUCOPHAGE) 500 MG tablet Take 500 mg by mouth 2 (two) times daily. 11/05/17   [provider]  metoprolol succinate  (TOPROL-XL) 50 MG 24 hr tablet Take 50 mg by mouth daily. Take with or immediately following a meal.    [provider]  nitroGLYCERIN (NITROSTAT) 0.4 MG SL tablet Place 1 tablet (0.4 mg total) under the tongue every 5 (five) minutes as needed. 12/17/17 03/17/18  Revankar, Aundra Dubin, MD  simvastatin (ZOCOR) 10 MG tablet Take 10 mg by mouth daily.    [provider]  traMADol (ULTRAM) 50 MG tablet Take 1 tablet (50 mg total) by mouth every 6 (six) hours as needed. 08/20/18   Gilda Crease, MD    Family History No family history on file.  Social History Social History   Tobacco Use  . Smoking status: Never Smoker  . Smokeless tobacco: Never Used  Substance Use Topics  . Alcohol use: Yes  . Drug use: Yes    Types: Marijuana     Allergies   Patient has no known allergies.   Review of Systems Review of Systems  Constitutional: Negative for chills and fever.  HENT: Positive for hearing loss. Negative for ear pain, rhinorrhea, sinus pressure, sore throat, tinnitus and trouble swallowing.   Eyes: Negative for redness and visual disturbance.  Respiratory: Negative for cough and shortness of breath.   Cardiovascular: Negative for chest pain and leg swelling.  Gastrointestinal: Negative for abdominal pain, diarrhea, nausea and vomiting.  Genitourinary: Negative for dysuria.  Musculoskeletal: Negative for back pain and neck pain.  Skin: Negative for rash.  Neurological: Negative for dizziness, light-headedness and headaches.  Hematological: Does not bruise/bleed easily.  Psychiatric/Behavioral: Negative for confusion.     Physical Exam Updated Vital Signs BP (!) 160/100 (BP Location: Right Arm)   Pulse 62   Temp 98 F (36.7 C) (Oral)   Resp 16   Ht 1.829 m (6')   Wt 104.3 kg   SpO2 100%   BMI 31.19 kg/m   Physical Exam Vitals signs and nursing note reviewed.  Constitutional:      Appearance: Normal appearance. He is well-developed.  HENT:      Head: Normocephalic and atraumatic.     Right Ear: Tympanic membrane normal.     Left Ear: There is impacted cerumen.     Mouth/Throat:     Mouth: Mucous membranes are moist.  Eyes:     Extraocular Movements: Extraocular movements intact.     Conjunctiva/sclera: Conjunctivae normal.     Pupils: Pupils are equal, round, and reactive to light.  Neck:     Musculoskeletal: Normal range of motion and neck supple. No neck rigidity.  Cardiovascular:     Rate and Rhythm: Normal rate and regular rhythm.     Heart sounds: No murmur.  Pulmonary:     Effort: Pulmonary effort is normal. No respiratory distress.     Breath sounds: Normal breath sounds.  Abdominal:     General: Bowel sounds are normal.     Palpations: Abdomen is soft.     Tenderness: There is no abdominal tenderness.  Musculoskeletal: Normal range of motion.  Skin:    General: Skin is warm and dry.     Capillary Refill: Capillary refill takes less than 2 seconds.  Neurological:     General: No focal deficit present.     Mental Status: He is alert and oriented to person, place, and time.     Cranial Nerves: No cranial nerve deficit.      ED Treatments / Results  Labs (all labs ordered are listed, but only abnormal results are displayed) Labs Reviewed - No data to display  EKG None  Radiology No results found.  Procedures Procedures (including critical care time)  Medications Ordered in ED Medications - No data to display   Initial Impression / Assessment and Plan / ED Course  I have reviewed the triage vital signs and the nursing notes.  Pertinent labs & imaging results that were available during my care of the patient were reviewed by me and considered in my medical decision making (see chart for details).        Left ear irrigated by nursing.  All wax removed.  Patient states still feels as if he is not hearing well.  Patient does relate a popping sensation throughout the night which may represent a  pressure differential in the middle ear.  Patient does state hearing is improved some.  But does not feel its back to normal.  Still some fluid in the canal externally.  TM wet.  Following irrigation of the wax.  Left TM does appear normal.  Recommended chewing gum for a couple days.  If it does not clear up patient given referral to ear nose and throat.  Final Clinical Impressions(s) / ED Diagnoses   Final diagnoses:  Impacted cerumen of left ear    ED Discharge Orders    None       Vanetta MuldersZackowski, Pari Lombard, MD 03/24/19 [phone]

## 2019-03-24 NOTE — ED Notes (Signed)
Pt verbalized understanding of dc instructions.

## 2019-03-24 NOTE — ED Triage Notes (Signed)
Pt reports he has wax build up in L ear.

## 2019-03-24 NOTE — Discharge Instructions (Addendum)
As we discussed some of this may be middle ear pressure differential.  Recommend chewing gum for the next couple days.  Follow-up with ear nose and throat if things do not clear up.  Also recommend using Debrox on a regular basis.  Return for any new or worse symptoms.

## 2019-07-15 IMAGING — CR DG CHEST 2V
2 series · 2 of 2 positions shown · non-contrast
Comparison: None.

CLINICAL DATA: Chest pain, shortness of breath, diaphoresis, and
dizziness for several days.

EXAM:
CHEST  2 VIEW

[w chest pa]
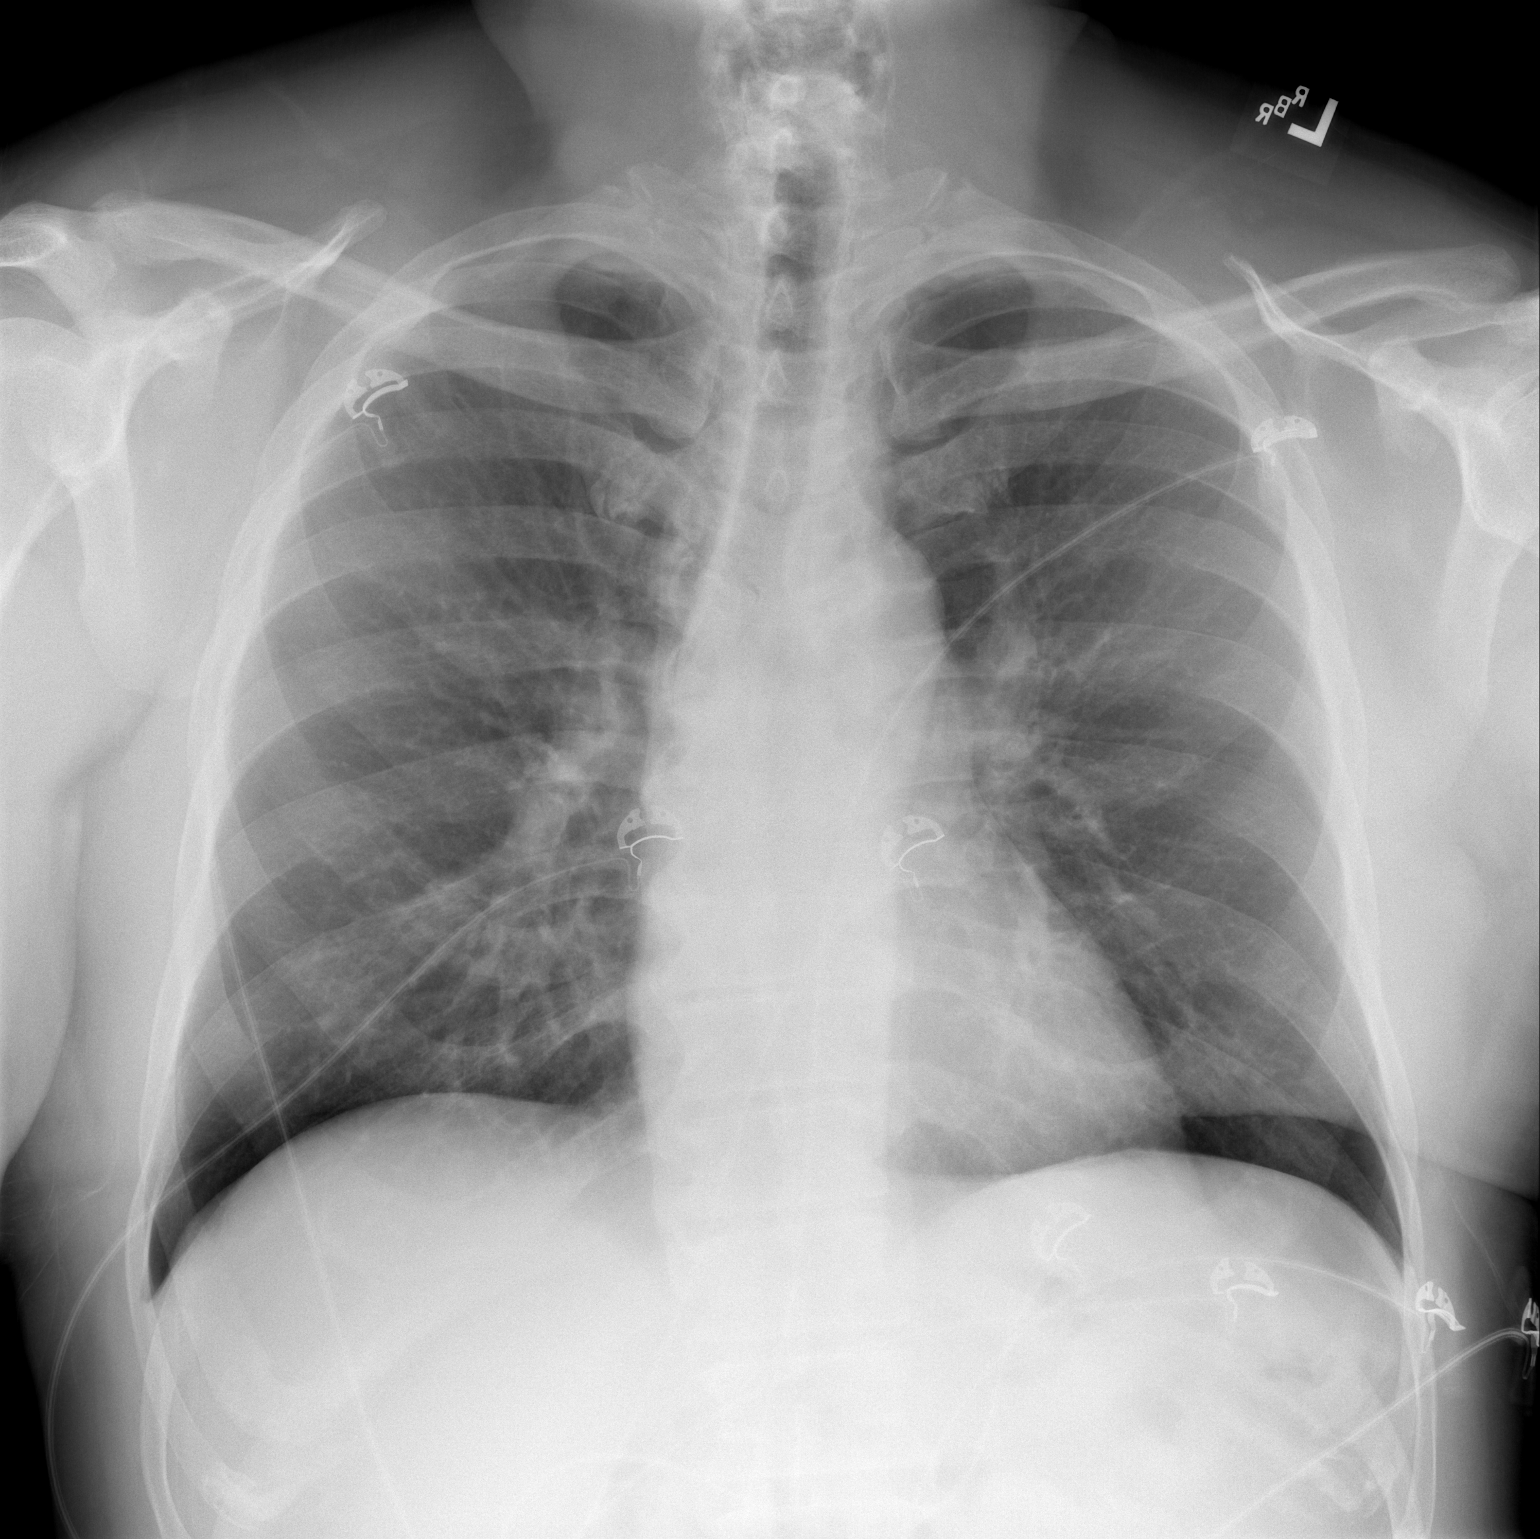

[w chest lat]
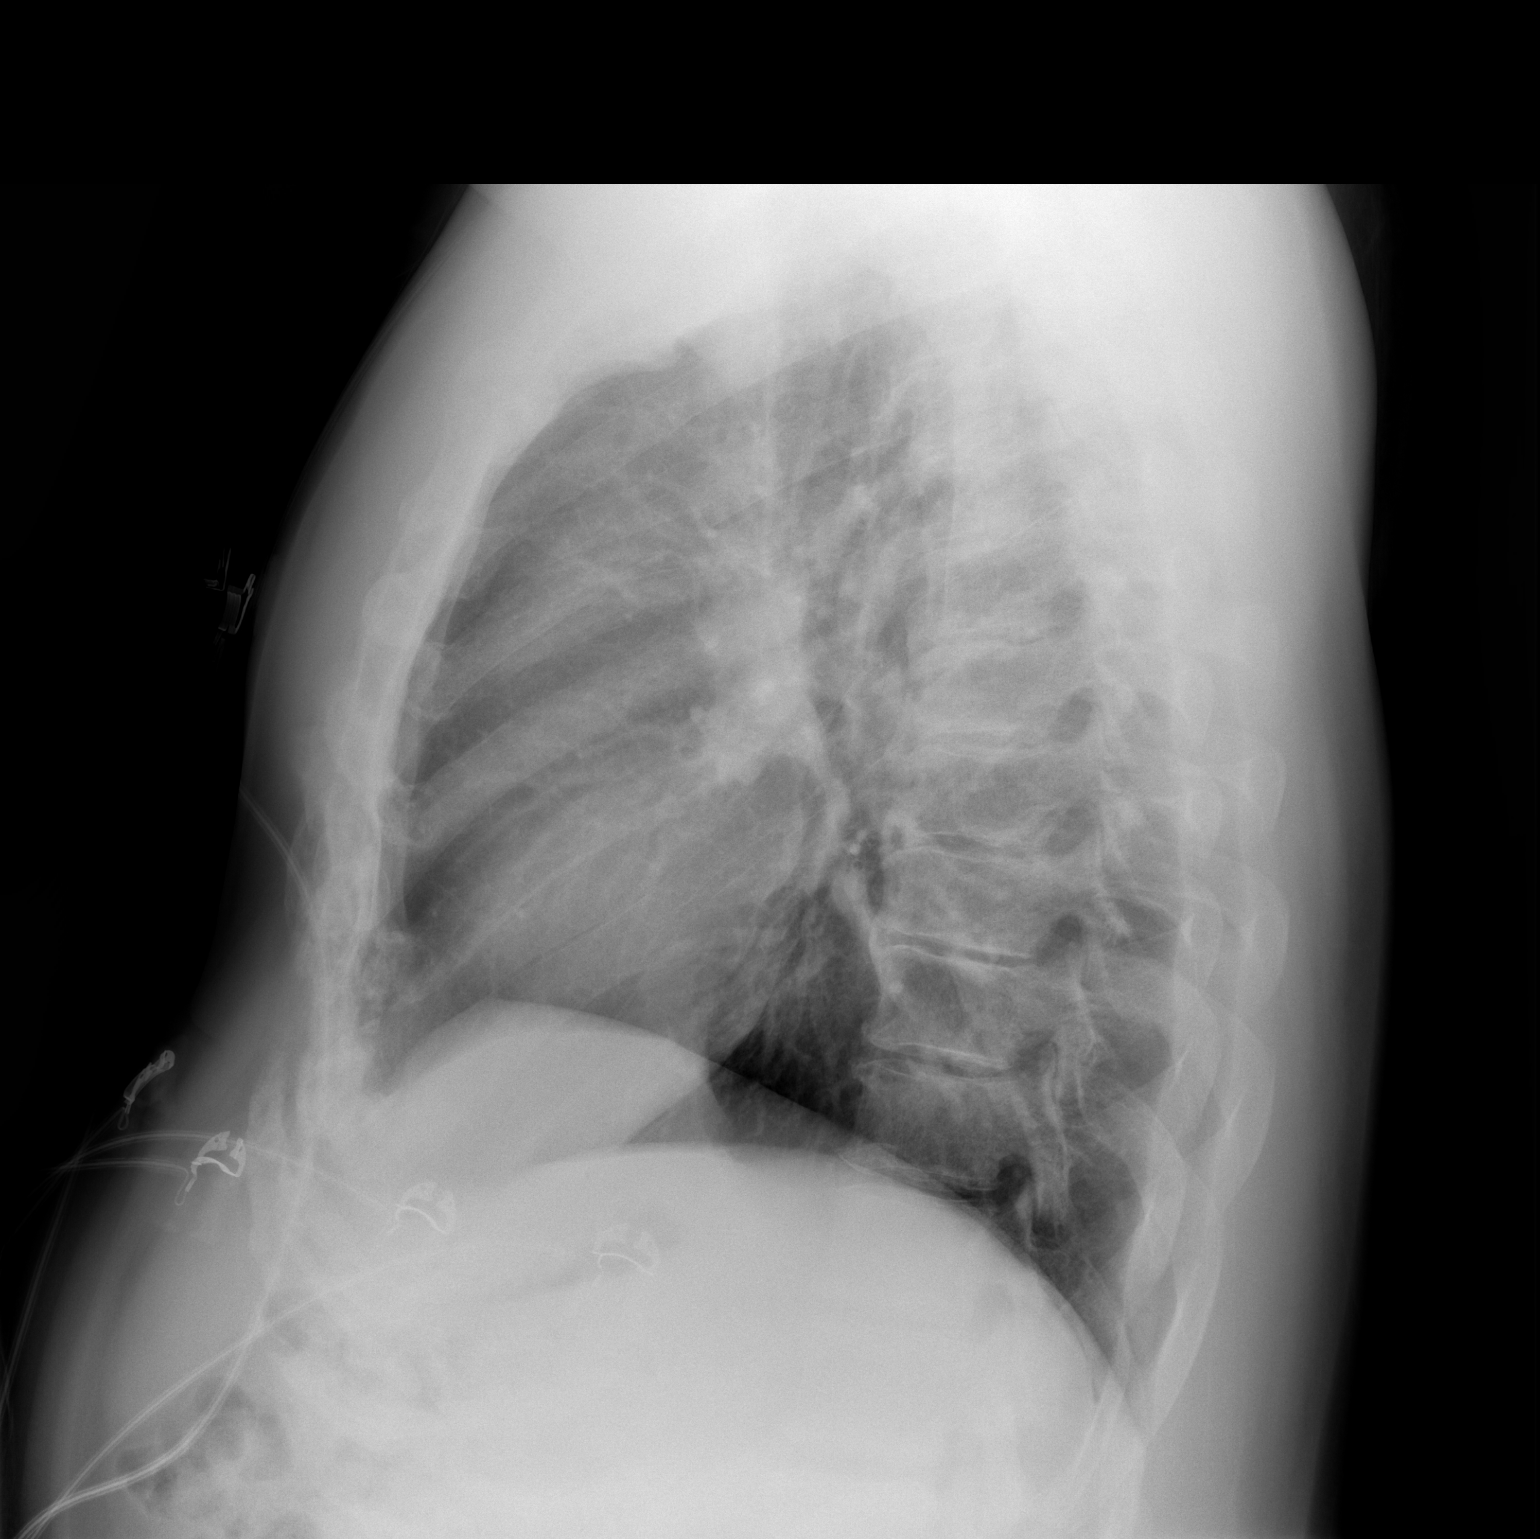

[2 of 2 positions shown; findings below may reference images not displayed]

FINDINGS: The cardiomediastinal silhouette is within normal limits. The lungs
are well inflated and clear. There is no evidence of pleural
effusion or pneumothorax. Thoracic spondylosis and mild
dextroscoliosis are noted.
IMPRESSION: No active cardiopulmonary disease.

## 2020-03-23 IMAGING — CT CT NECK W/ CM
3 series · 15 of 33 positions shown, 18 images · IV contrast (iopamidol)
Comparison: None.

CLINICAL DATA: Throw pain since [REDACTED].

EXAM:
CT NECK WITH CONTRAST
TECHNIQUE: Multidetector CT imaging of the neck was performed using the
standard protocol following the bolus administration of intravenous
contrast.
CONTRAST:  80mL [id number] IOPAMIDOL (M1XJ1K-D44) INJECTION 61%

[Series 3: axial neck · axial · 0.57mm/px · z∈[-264,-56]mm · 7 of 124 slices shown, 9 images]
[im 10/124  soft-tissue]
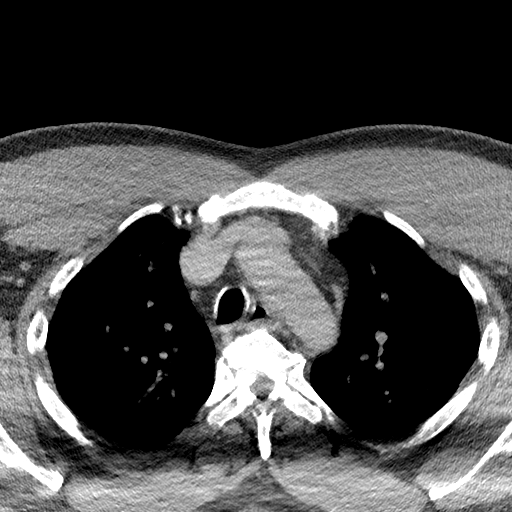
[im 10/124  bone]
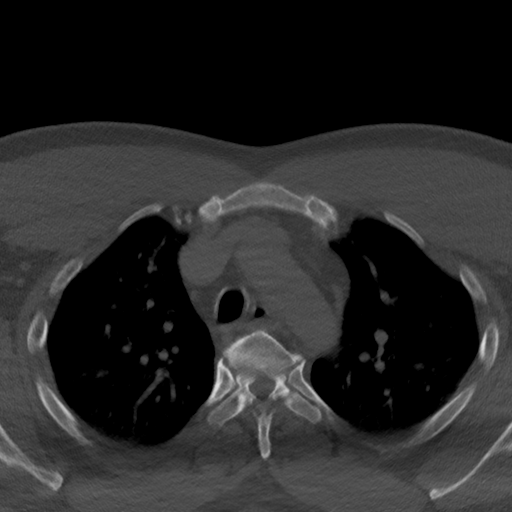
[im 29/124  bone]
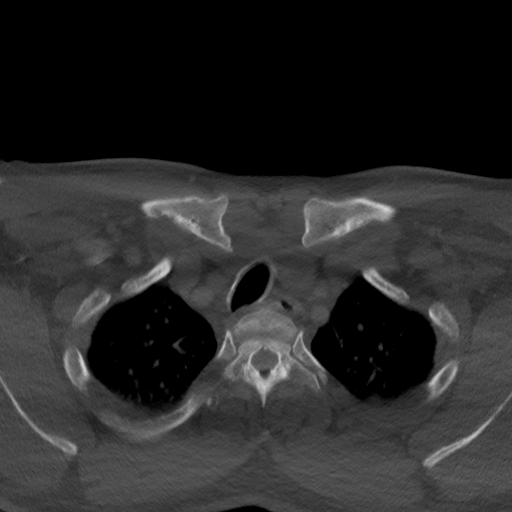
[im 48/124  bone]
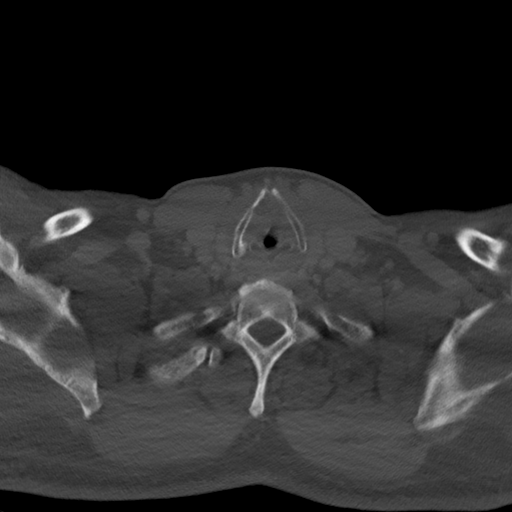
[im 67/124  bone]
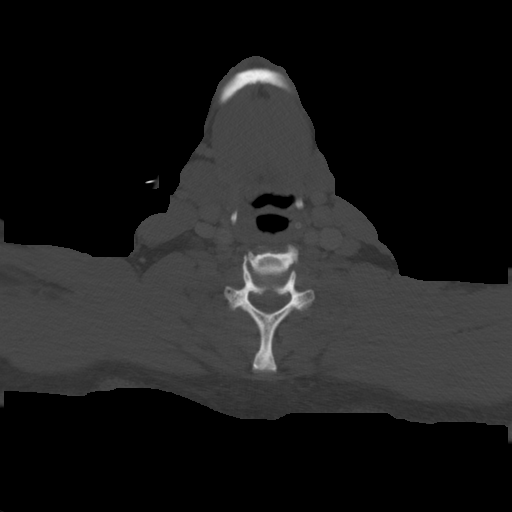
[im 76/124  soft-tissue]
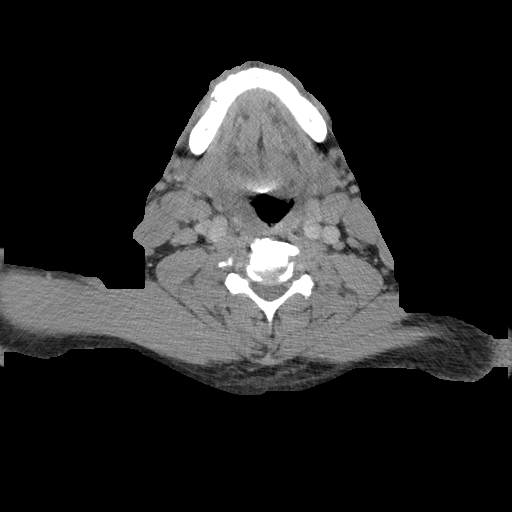
[im 76/124  bone]
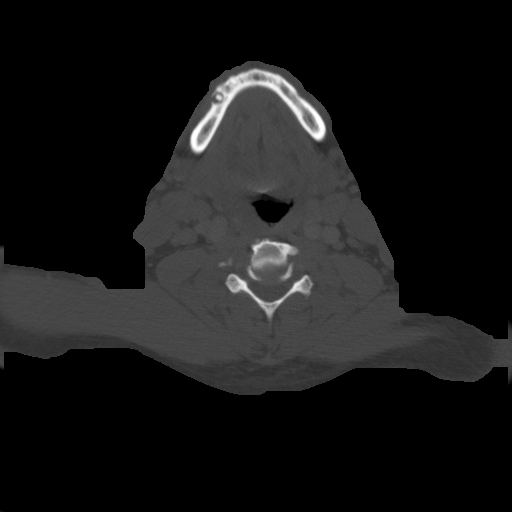
[im 95/124  bone]
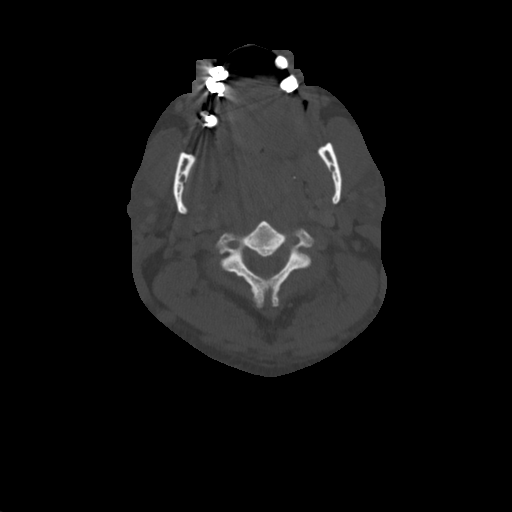
[im 114/124  bone]
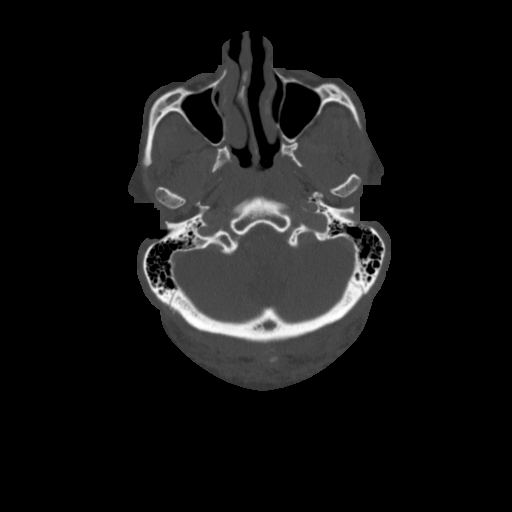

[Series 6: sag neck · sagittal · 0.50mm/px · 5 of 129 slices shown, 6 images]
[im 43/129  bone]
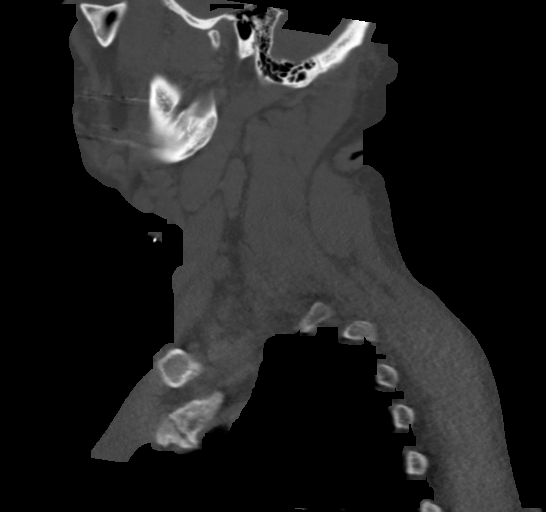
[im 54/129  bone]
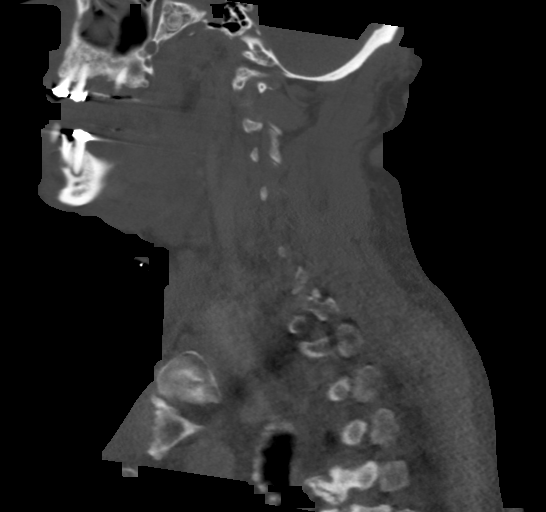
[im 65/129  soft-tissue]
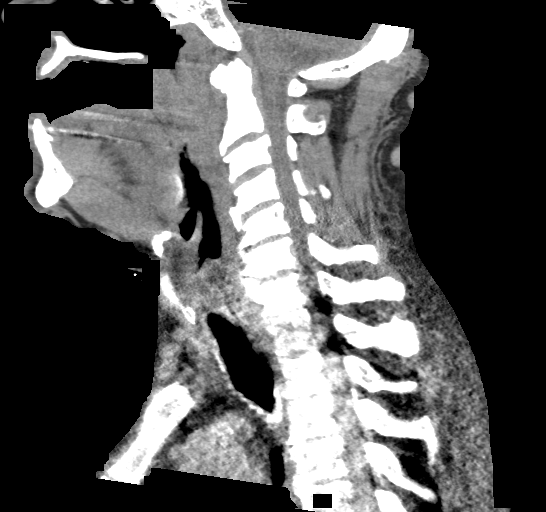
[im 65/129  bone]
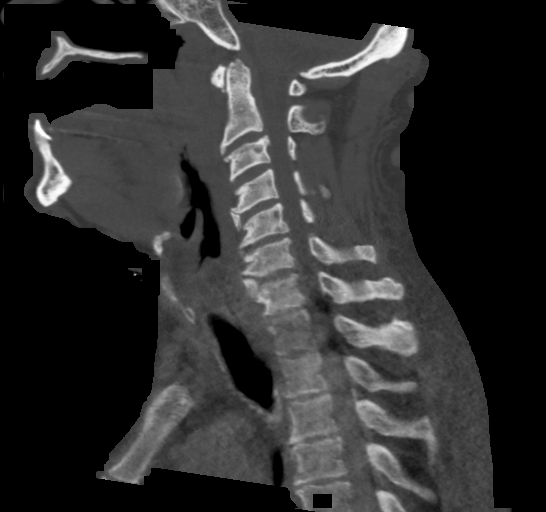
[im 75/129  bone]
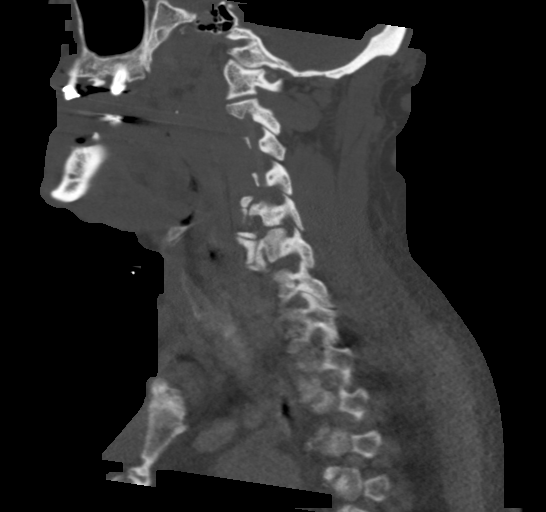
[im 86/129  bone]
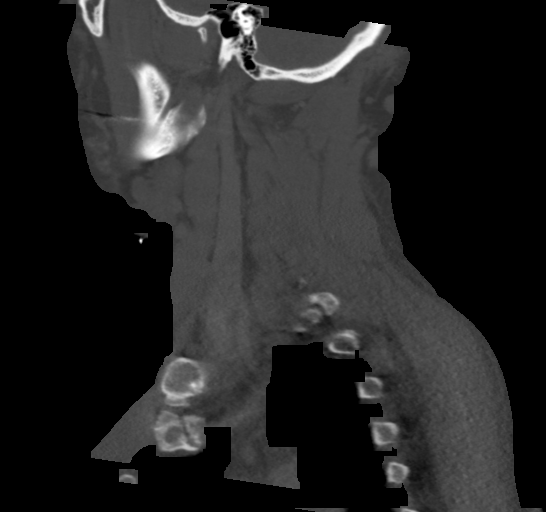

[Series 7: cor neck · coronal · 0.47mm/px · 3 of 111 slices shown]
[im 24/111  bone]
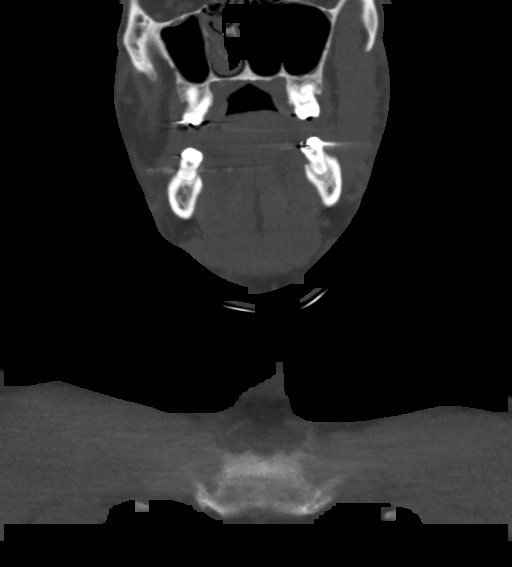
[im 45/111  bone]
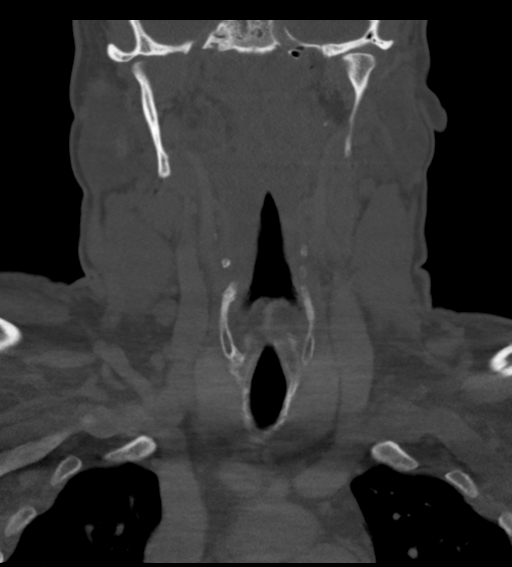
[im 66/111  bone]
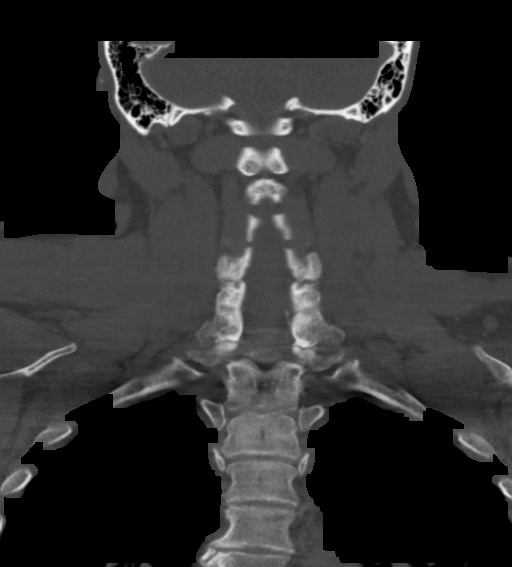

[15 of 33 positions shown; findings below may reference images not displayed]

FINDINGS: PHARYNX AND LARYNX:

--Nasopharynx: Marked enlargement of the adenoid tonsils.

--Oral cavity and oropharynx: Palatine tonsils are enlarged. No
peritonsillar fluid collection. Normal lingual tonsils.

--Hypopharynx: Normal vallecula and pyriform sinuses.

--Larynx: Normal epiglottis and pre-epiglottic space. Normal
aryepiglottic and vocal folds.

--Retropharyngeal space: No abscess, effusion or lymphadenopathy.

SALIVARY GLANDS:

--Parotid: No mass lesion or inflammation. No sialolithiasis or
ductal dilatation.

--Submandibular: Symmetric without inflammation. No sialolithiasis
or ductal dilatation.

--Sublingual: Normal. No ranula or other visible lesion of the base
of tongue and floor of mouth.

THYROID: Normal.

LYMPH NODES: Enlarged bilateral lymph nodes, measuring up to 23 mm
at right level 2A and 15 mm at left level 2A.

VASCULAR: Major cervical vessels are patent.

LIMITED INTRACRANIAL: Normal.

VISUALIZED ORBITS: Normal.

MASTOIDS AND VISUALIZED PARANASAL SINUSES: No fluid levels or
advanced mucosal thickening. No mastoid effusion.

SKELETON: No bony spinal canal stenosis. No lytic or blastic
lesions.

UPPER CHEST: Clear.

OTHER: None.
IMPRESSION: Enlarged adenoid and palatine tonsils with multiple enlarged
bilateral cervical lymph nodes. The findings are most consistent
with acute tonsillopharyngitis and reactive lymphadenopathy. If
these findings do not resolve with treatment, that would raise the
possibility of a lymphoproliferative disorder.

## 2021-12-17 ENCOUNTER — Emergency Department (HOSPITAL_BASED_OUTPATIENT_CLINIC_OR_DEPARTMENT_OTHER)
Admission: EM | Admit: 2021-12-17 | Discharge: 2021-12-17 | Disposition: A | Discharge status: Home / Self Care | Payer: BC Managed Care – PPO | Attending: Emergency Medicine | Admitting: Emergency Medicine

## 2021-12-17 ENCOUNTER — Other Ambulatory Visit: Payer: Self-pay

## 2021-12-17 ENCOUNTER — Encounter (HOSPITAL_BASED_OUTPATIENT_CLINIC_OR_DEPARTMENT_OTHER): Payer: Self-pay

## 2021-12-17 DIAGNOSIS — R0989 Other specified symptoms and signs involving the circulatory and respiratory systems: Secondary | ICD-10-CM

## 2021-12-17 DIAGNOSIS — T7840XA Allergy, unspecified, initial encounter: Secondary | ICD-10-CM | POA: Diagnosis present

## 2021-12-17 MED ORDER — ALUM & MAG HYDROXIDE-SIMETH 200-200-20 MG/5ML PO SUSP
30.0000 mL | Freq: Once | ORAL | Status: AC
  Administered 2021-12-17: 30 mL via ORAL
  Filled 2021-12-17: qty 30

## 2021-12-17 NOTE — ED Triage Notes (Signed)
Pt arrives with reports of allergic reaction to antibiotic he was given for a dental infection. Pt has been on antibiotic for 2 days. States that this morning he felt like he had something stuck in his throat. Pt is on clindamycin.

## 2021-12-17 NOTE — ED Notes (Signed)
BBS CTA no stridor noted, upper airway WNL

## 2021-12-17 NOTE — ED Provider Notes (Signed)
[facility] EMERGENCY DEPARTMENT  Provider Note  CSN: [id number] Arrival date & time: 12/17/21 1013  History Chief Complaint  Patient presents with   Allergic Reaction    Christian Aguirre is a 61 y.o. male reports he has been taking Clindamycin for a toothache for the last two days. After taking the pill last night he felt like it got stuck in his throat. Continued to have a globus sensation although was able to swallow bread. He went to sleep but woke up still with some symptoms and was concerned it may be an allergic reaction. No difficulty breathing, rash, SOB, tongue or lip swelling.    Home Medications Prior to Admission medications   Medication Sig Start Date End Date Taking? Authorizing Provider  ALPRAZolam Prudy Feeler) 0.5 MG tablet Take 0.5 tablets (0.25 mg total) by mouth at bedtime as needed for anxiety. 12/17/17   Revankar, Aundra Dubin, MD  aspirin EC 81 MG tablet Take 2 tablets (162 mg total) by mouth daily. 12/17/17   Revankar, Aundra Dubin, MD  diclofenac (VOLTAREN) 50 MG EC tablet Take by mouth. 06/22/17   [provider]  lisinopril (PRINIVIL,ZESTRIL) 20 MG tablet Take 20 mg by mouth daily.    [provider]  metFORMIN (GLUCOPHAGE) 500 MG tablet Take 500 mg by mouth 2 (two) times daily. 11/05/17   [provider]  nitroGLYCERIN (NITROSTAT) 0.4 MG SL tablet Place 1 tablet (0.4 mg total) under the tongue every 5 (five) minutes as needed. 12/17/17 03/17/18  Revankar, Aundra Dubin, MD     Allergies    Patient has no known allergies.   Review of Systems   Review of Systems Please see HPI for pertinent positives and negatives  Physical Exam BP (!) 177/104    Pulse 66    Temp 97.9 F (36.6 C) (Oral)    Resp 16    Ht 6' (1.829 m)    Wt 104.3 kg    SpO2 100%    BMI 31.19 kg/m   Physical Exam Vitals and nursing note reviewed.  Constitutional:      Appearance: Normal appearance.  HENT:     Head: Normocephalic and atraumatic.     Nose: Nose normal.      Mouth/Throat:     Mouth: Mucous membranes are moist.     Comments: No angioedema or lip, tongue or posterior pharynx Eyes:     Extraocular Movements: Extraocular movements intact.     Conjunctiva/sclera: Conjunctivae normal.  Cardiovascular:     Rate and Rhythm: Normal rate.  Pulmonary:     Effort: Pulmonary effort is normal.     Breath sounds: Normal breath sounds. No stridor.  Abdominal:     General: Abdomen is flat.     Palpations: Abdomen is soft.     Tenderness: There is no abdominal tenderness.  Musculoskeletal:        General: No swelling. Normal range of motion.     Cervical back: Neck supple.  Skin:    General: Skin is warm and dry.  Neurological:     General: No focal deficit present.     Mental Status: He is alert.  Psychiatric:        Mood and Affect: Mood normal.    ED Results / Procedures / Treatments   EKG None  Procedures Procedures  Medications Ordered in the ED Medications  alum & mag hydroxide-simeth (MAALOX/MYLANTA) 200-200-20 MG/5ML suspension 30 mL (30 mLs Oral Given 12/17/21 1058)    Initial Impression  and Plan  Patient with globus/FB sensation after swallowing a pill last night. Concerned it may be an allergic reaction but no signs of allergy/angioedema on exam. He has been able to swallow. Will give a GI cocktail and see if that improves his symptoms.   ED Course   Clinical Course as of 12/17/21 1152  Sat Dec 17, 2021  1151 Patient reports symptoms improved with GI cocktail. He does not have any signs of allergic reaction or impending airway compromise. Given presenting complaint, I considered that admission might be necessary. After review of results from ED evaluation, admission to the hospital is not indicated at this time.   [CS]    Clinical Course User Index [CS] Pollyann Savoy, MD     MDM Rules/Calculators/A&P Medical Decision Making Problems Addressed: Globus sensation: acute illness or injury  Risk OTC drugs. Decision  regarding hospitalization.    Final Clinical Impression(s) / ED Diagnoses Final diagnoses:  Globus sensation    Rx / DC Orders ED Discharge Orders     None        Pollyann Savoy, MD 12/17/21 1152

## 2022-08-19 ENCOUNTER — Emergency Department (HOSPITAL_BASED_OUTPATIENT_CLINIC_OR_DEPARTMENT_OTHER)
Admission: EM | Admit: 2022-08-19 | Discharge: 2022-08-19 | Disposition: A | Discharge status: Home / Self Care | Payer: BC Managed Care – PPO | Attending: Emergency Medicine | Admitting: Emergency Medicine

## 2022-08-19 ENCOUNTER — Emergency Department (HOSPITAL_BASED_OUTPATIENT_CLINIC_OR_DEPARTMENT_OTHER): Payer: BC Managed Care – PPO | Admitting: Radiology

## 2022-08-19 ENCOUNTER — Other Ambulatory Visit: Payer: Self-pay

## 2022-08-19 ENCOUNTER — Encounter (HOSPITAL_BASED_OUTPATIENT_CLINIC_OR_DEPARTMENT_OTHER): Payer: Self-pay | Admitting: Emergency Medicine

## 2022-08-19 DIAGNOSIS — Z79899 Other long term (current) drug therapy: Secondary | ICD-10-CM | POA: Insufficient documentation

## 2022-08-19 DIAGNOSIS — Z87891 Personal history of nicotine dependence: Secondary | ICD-10-CM | POA: Diagnosis not present

## 2022-08-19 DIAGNOSIS — F439 Reaction to severe stress, unspecified: Secondary | ICD-10-CM | POA: Diagnosis not present

## 2022-08-19 DIAGNOSIS — Z7982 Long term (current) use of aspirin: Secondary | ICD-10-CM | POA: Insufficient documentation

## 2022-08-19 DIAGNOSIS — R079 Chest pain, unspecified: Secondary | ICD-10-CM

## 2022-08-19 DIAGNOSIS — R0789 Other chest pain: Secondary | ICD-10-CM | POA: Diagnosis present

## 2022-08-19 DIAGNOSIS — R002 Palpitations: Secondary | ICD-10-CM | POA: Insufficient documentation

## 2022-08-19 DIAGNOSIS — E119 Type 2 diabetes mellitus without complications: Secondary | ICD-10-CM | POA: Diagnosis not present

## 2022-08-19 LAB — BASIC METABOLIC PANEL
Anion gap: 11 (ref 5–15)
BUN: 23 mg/dL — ABNORMAL HIGH (ref 6–20)
CO2: 29 mmol/L (ref 22–32)
Calcium: 10 mg/dL (ref 8.9–10.3)
Chloride: 101 mmol/L (ref 98–111)
Creatinine, Ser: 1.02 mg/dL (ref 0.61–1.24)
GFR, Estimated: >60 mL/min (ref >60)
Glucose, Bld: 98 mg/dL (ref 70–99)
Potassium: 3.7 mmol/L (ref 3.5–5.1)
Sodium: 141 mmol/L (ref 135–145)

## 2022-08-19 LAB — CBC
HCT: 42 % (ref 39.0–52.0)
Hemoglobin: 14.1 g/dL (ref 13.0–17.0)
MCH: 30.4 pg (ref 26.0–34.0)
MCHC: 33.6 g/dL (ref 30.0–36.0)
MCV: 90.5 fL (ref 80.0–100.0)
Platelets: 276 10*3/uL (ref 150–400)
RBC: 4.64 MIL/uL (ref 4.22–5.81)
RDW: 13 % (ref 11.5–15.5)
WBC: 4.8 10*3/uL (ref 4.0–10.5)
nRBC: 0 % (ref 0.0–0.2)

## 2022-08-19 LAB — TROPONIN I (HIGH SENSITIVITY)
Troponin I (High Sensitivity): 5 ng/L (ref <18)
Troponin I (High Sensitivity): 4 ng/L (ref <18)

## 2022-08-19 MED ORDER — HYDROXYZINE HCL 25 MG PO TABS: 25.0000 mg | ORAL_TABLET | Freq: Four times a day (QID) | ORAL | 0 refills | Status: AC

## 2022-08-19 NOTE — Discharge Instructions (Addendum)
I have given you the information for cardiology office to follow-up with.  I recommend taking hydroxyzine as prescribed and may take up to once every 6 hours.  Continue taking your aspirin and other prescribed medications.  Return immediately to emergency room/call 911 for any new or worsening symptoms, coughing up blood, fevers, difficulty breathing.

## 2022-08-19 NOTE — ED Triage Notes (Signed)
Left chest pain for about a week, intermittent, sharp in nature, but today became diaphoretic. Pt feeling anxious at night feels like heart is racing and cant sleep.

## 2022-08-19 NOTE — ED Provider Notes (Cosign Needed Addendum)
[facility] EMERGENCY DEPT Provider Note   CSN: [id number] Arrival date & time: 08/19/22  1213     History  Chief Complaint  Patient presents with   Chest Pain    SANJIT MCMICHAEL is a 61 y.o. male.   Jebediah Macrae is a 62 yo male with type 2 diabetes complaining of chest pain. Onset 10 days ago and described as a sharp pain in the left chest that is non-radiating. Pt states he has had this pain in the past that would worsen with caffeine and smoking but always seemed to resolve on its own. Pt stopped smoking 12 years ago. Currently drinks 2 beers a night, occasionally smokes weed, denies any other illicit drug use. ROS positive for chest pain, SOB, dry cough. Pt denies wheezing or leg edema.   No recent surgeries, hospitalization, long travel, hemoptysis, estrogen containing OCP, cancer history.  No unilateral leg swelling.  No history of PE or VTE.   No unilateral numbness or weakness in extremities other than some right knuckle paresthesias for months.  No changes today. No chest trauma.  Denies any shortness of breath currently.   Also endorses some palpitations but he states these have been ongoing for quite some time.  He also endorses his work environment being quite stressful.    Home Medications Prior to Admission medications   Medication Sig Start Date End Date Taking? Authorizing Provider  hydrOXYzine (ATARAX) 25 MG tablet Take 1 tablet (25 mg total) by mouth every 6 (six) hours for 21 days. 08/19/22 09/09/22 Yes Thera Basden, Rodrigo Ran, PA  ALPRAZolam (XANAX) 0.5 MG tablet Take 0.5 tablets (0.25 mg total) by mouth at bedtime as needed for anxiety. 12/17/17   Revankar, Aundra Dubin, MD  aspirin EC 81 MG tablet Take 2 tablets (162 mg total) by mouth daily. 12/17/17   Revankar, Aundra Dubin, MD  diclofenac (VOLTAREN) 50 MG EC tablet Take by mouth. 06/22/17   [provider]  lisinopril (PRINIVIL,ZESTRIL) 20 MG tablet Take 20 mg by mouth daily.    [provider]  metFORMIN (GLUCOPHAGE) 500 MG tablet Take 500 mg by mouth 2 (two) times daily. 11/05/17   [provider]  nitroGLYCERIN (NITROSTAT) 0.4 MG SL tablet Place 1 tablet (0.4 mg total) under the tongue every 5 (five) minutes as needed. 12/17/17 03/17/18  Revankar, Aundra Dubin, MD      Allergies    Patient has no known allergies.    Review of Systems   Review of Systems  Cardiovascular:  Positive for chest pain.    Physical Exam Updated Vital Signs BP (!) 154/81 (BP Location: Right Arm)   Pulse 70   Temp (!) 96.9 F (36.1 C)   Resp 14   Ht 6' (1.829 m)   Wt 97.5 kg   SpO2 100%   BMI 29.16 kg/m  Physical Exam Vitals and nursing note reviewed.  Constitutional:      General: He is not in acute distress. HENT:     Head: Normocephalic and atraumatic.     Nose: Nose normal.     Mouth/Throat:     Mouth: Mucous membranes are moist.  Eyes:     General: No scleral icterus. Cardiovascular:     Rate and Rhythm: Normal rate and regular rhythm.     Pulses: Normal pulses.     Heart sounds: Normal heart sounds.     Comments: Pulses symmetric Pulmonary:     Effort: Pulmonary effort is normal. No respiratory distress.  Breath sounds: No wheezing.  Abdominal:     Palpations: Abdomen is soft.     Tenderness: There is no abdominal tenderness. There is no guarding or rebound.  Musculoskeletal:     Cervical back: Normal range of motion.     Right lower leg: No edema.     Left lower leg: No edema.  Skin:    General: Skin is warm and dry.     Capillary Refill: Capillary refill takes less than 2 seconds.  Neurological:     Mental Status: He is alert. Mental status is at baseline.  Psychiatric:        Mood and Affect: Mood normal.        Behavior: Behavior normal.     ED Results / Procedures / Treatments   Labs (all labs ordered are listed, but only abnormal results are displayed) Labs Reviewed  BASIC METABOLIC PANEL - Abnormal; Notable for the following  components:      Result Value   BUN 23 (*)    All other components within normal limits  CBC  TROPONIN I (HIGH SENSITIVITY)  TROPONIN I (HIGH SENSITIVITY)    EKG None  Radiology DG Chest 2 View  Result Date: 08/19/2022 CLINICAL DATA:  chest pain EXAM: CHEST - 2 VIEW COMPARISON:  February 2019 FINDINGS: The cardiomediastinal silhouette is within normal limits. No pleural effusion. No pneumothorax. No mass or consolidation. No acute osseous abnormality. IMPRESSION: No acute findings in the chest. Electronically Signed   By: Olive Bass M.D.   On: 08/19/2022 12:43    Procedures Procedures    Medications Ordered in ED Medications - No data to display  ED Course/ Medical Decision Making/ A&P Clinical Course as of 08/19/22 1557  Sat Aug 19, 2022  1527 CP ~2 weeks  [WF]    Clinical Course User Index [WF] Gailen Shelter, [place]                           Medical Decision Making Amount and/or Complexity of Data Reviewed Labs: ordered. Radiology: ordered.  Risk Prescription drug management.   This patient presents to the ED for concern of chest pain, this involves a number of treatment options, and is a complaint that carries with it a high risk of complications and morbidity. A differential diagnosis was considered for the patient's symptoms which is discussed below:   The emergent causes of chest pain include: Acute coronary syndrome, tamponade, pericarditis/myocarditis, aortic dissection, pulmonary embolism, tension pneumothorax, pneumonia, and esophageal rupture.   Other urgent/non-acute considerations include, but are not limited to: chronic angina, aortic stenosis, cardiomyopathy, mitral valve prolapse, pulmonary hypertension, aortic insufficiency, right ventricular hypertrophy, pleuritis, bronchitis, pneumothorax, tumor, gastroesophageal reflux disease (GERD), esophageal spasm, Mallory-Weiss syndrome, peptic ulcer disease, pancreatitis, functional gastrointestinal pain,  cervical or thoracic disk disease or arthritis, shoulder arthritis, costochondritis, subacromial bursitis, anxiety or panic attack, herpes zoster, breast disorders, chest wall tumors, thoracic outlet syndrome, mediastinitis.    Co morbidities: Discussed in HPI   Brief History:  Andros Channing is a 61 yo male with type 2 diabetes complaining of chest pain. Onset 10 days ago and described as a sharp pain in the left chest that is non-radiating. Pt states he has had this pain in the past that would worsen with caffeine and smoking but always seemed to resolve on its own. Pt stopped smoking 12 years ago. Currently drinks 2 beers a night, occasionally smokes weed, denies any other illicit drug  use. ROS positive for chest pain, SOB, dry cough. Pt denies wheezing or leg edema.   No recent surgeries, hospitalization, long travel, hemoptysis, estrogen containing OCP, cancer history.  No unilateral leg swelling.  No history of PE or VTE.   No unilateral numbness or weakness in extremities other than some right knuckle paresthesias for months.  No changes today. No chest trauma.  Denies any shortness of breath currently.   Also endorses some palpitations but he states these have been ongoing for quite some time.  He also endorses his work environment being quite stressful.    EMR reviewed including pt PMHx, past surgical history and past visits to ER.   See HPI for more details   Lab Tests:   I ordered and independently interpreted labs. Labs notable for Labs unremarkable.  Troponin x2 within normal limits and without significant delta.   Imaging Studies:  NAD. I personally reviewed all imaging studies and no acute abnormality found. I agree with radiology interpretation.    Cardiac Monitoring:  The patient was maintained on a cardiac monitor.  I personally viewed and interpreted the cardiac monitored which showed an underlying rhythm of: NSR EKG non-ischemic   Medicines  ordered:  No symptoms currently.   Critical Interventions:     Consults/Attending Physician      Reevaluation:  After the interventions noted above I re-evaluated patient and found that they have :resolved   Social Determinants of Health:      Problem List / ED Course:  Chest pain -now resolved.  Seems to be episodic over the past approximately 10 days to 2 weeks.  No associated shortness of breath or nausea or vomiting.  Symptoms are neither exertional nor pleuritic.  PERC negative.  I have a very low suspicion for PE as he has no pleuritic pain his vital signs are normal he is not particularly short of breath and his SPO2 was 100% on room air with no tachycardia.  Low suspicion for dissection as his chest pain is episodic and he has no chest pain now seems to be associated with no sensory or pulse discrepancy.  Low suspicion for tamponade as he has no JVP, heart has normal contours and blood pressures are normal to slightly hypertensive.  Lungs are clear to auscultation.  Chest x-ray without PTX.  Will discharge home with close follow-up with cardiology.  Very strict return precautions discussed we will provide with some hydroxyzine as anxiety may be causing or con commitment heart score is 3.   Dispostion:  After consideration of the diagnostic results and the patients response to treatment, I feel that the patent would benefit from close outpatient follow-up.   Final Clinical Impression(s) / ED Diagnoses Final diagnoses:  Chest pain, unspecified type  Palpitation    Rx / DC Orders ED Discharge Orders          Ordered    Ambulatory referral to Cardiology       Comments: If you have not heard from the Cardiology office within the next 72 hours please call [phone].   08/19/22 1555    hydrOXYzine (ATARAX) 25 MG tablet  Every 6 hours        08/19/22 1556              Tedd Sias, [place] 08/19/22 1559    Tedd Sias, [place] 08/19/22 1600     Tegeler, Gwenyth Allegra, MD 08/20/22 0005

## 2022-09-14 ENCOUNTER — Other Ambulatory Visit (INDEPENDENT_AMBULATORY_CARE_PROVIDER_SITE_OTHER): Payer: BC Managed Care – PPO

## 2022-09-14 ENCOUNTER — Ambulatory Visit (INDEPENDENT_AMBULATORY_CARE_PROVIDER_SITE_OTHER): Payer: BC Managed Care – PPO | Admitting: Cardiology

## 2022-09-14 ENCOUNTER — Encounter (HOSPITAL_BASED_OUTPATIENT_CLINIC_OR_DEPARTMENT_OTHER): Payer: Self-pay | Admitting: Cardiology

## 2022-09-14 VITALS — BP 136/90 | HR 59 | Ht 72.0 in | Wt 216.6 lb

## 2022-09-14 DIAGNOSIS — I10 Essential (primary) hypertension: Secondary | ICD-10-CM

## 2022-09-14 DIAGNOSIS — R002 Palpitations: Secondary | ICD-10-CM | POA: Diagnosis not present

## 2022-09-14 DIAGNOSIS — Z9189 Other specified personal risk factors, not elsewhere classified: Secondary | ICD-10-CM | POA: Diagnosis not present

## 2022-09-14 DIAGNOSIS — R079 Chest pain, unspecified: Secondary | ICD-10-CM

## 2022-09-14 DIAGNOSIS — Z7189 Other specified counseling: Secondary | ICD-10-CM

## 2022-09-14 NOTE — Patient Instructions (Addendum)
Medication Instructions:  Your physician recommends that you continue on your current medications as directed. Please refer to the Current Medication list given to you today.   Labwork: BMET 1 WEEK PRIOR TO CT   Testing/Procedures: Your physician has requested that you have cardiac CT. Cardiac computed tomography (CT) is a painless test that uses an x-ray machine to take clear, detailed pictures of your heart. For further information please visit [email or web address] Please follow instruction sheet as given.  14 day zio   Follow-Up: 4-5 weeks with Dr Cristal Deer   Any Other Special Instructions Will Be Listed Below (If Applicable).    Your cardiac CT will be scheduled at one of the below locations:   [facility] [address] [place], [place] [address] [phone]  OR  [facility] [address] [place], [place] [address] [phone]  OR   [facility] [address] [place], [place] [address] [phone]  If scheduled at [facility], please arrive at the [facility] and Children's Entrance (Entrance C2) of [facility] 30 minutes prior to test start time. You can use the FREE valet parking offered at entrance C (encouraged to control the heart rate for the test)  Proceed to the [facility] Radiology Department (first floor) to check-in and test prep.  All radiology patients and guests should use entrance C2 at [facility], accessed from [facility], even though the hospital's physical address listed is [address].    If scheduled at [facility] or [facility], please arrive 15 mins early for check-in and test prep.  Please follow these instructions carefully (unless otherwise directed):  Hold all erectile dysfunction medications at least 3 days (72 hrs) prior to test. (Ie viagra, cialis,  sildenafil, tadalafil, etc) We will administer nitroglycerin during this exam.   On the Night Before the Test: Be sure to Drink plenty of water. Do not consume any caffeinated/decaffeinated beverages or chocolate 12 hours prior to your test. Do not take any antihistamines 12 hours prior to your test.  On the Day of the Test: Drink plenty of water until 1 hour prior to the test. Do not eat any food 1 hour prior to test. You may take your regular medications prior to the test.  Take metoprolol (Lopressor) two hours prior to test. HOLD Furosemide/Hydrochlorothiazide morning of the test.      After the Test: Drink plenty of water. After receiving IV contrast, you may experience a mild flushed feeling. This is normal. On occasion, you may experience a mild rash up to 24 hours after the test. This is not dangerous. If this occurs, you can take Benadryl 25 mg and increase your fluid intake. If you experience trouble breathing, this can be serious. If it is severe call 911 IMMEDIATELY. If it is mild, please call our office. If you take any of these medications: Glipizide/Metformin, Avandament, Glucavance, please do not take 48 hours after completing test unless otherwise instructed.  We will call to schedule your test 2-4 weeks out understanding that some insurance companies will need an authorization prior to the service being performed.   For non-scheduling related questions, please contact the cardiac imaging nurse navigator should you have any questions/concerns: Rockwell Alexandria, Cardiac Imaging Nurse Navigator Larey Brick, Cardiac Imaging Nurse Navigator [facility] Direct Office Dial: [phone]   For scheduling needs, including cancellations and rescheduling, please call  [place], [phone].

## 2022-09-14 NOTE — Progress Notes (Signed)
Cardiology Office Note:    Date:  09/14/2022   ID:  Christian Aguirre, DOB 05-22-1961, MRN [id number]  PCP:  Pcp, No  Cardiologist:  Jodelle Red, MD  Referring MD: Gailen Shelter, PA   CC: New patient evaluation for chest pain  History of Present Illness:    Christian Aguirre is a 61 y.o. male with a hx of hypertension, type 2 diabetes, and ETOH abuse, who is seen as a new consult at the request of Gailen Shelter, PA for the evaluation and management of chest pain and palpitations.  Previously seen in cardiology 12/17/2017 by Dr. Tomie China for atypical chest pain. He was started on prn nitroglycerin, and daily 162 mg enteric-coated aspirin.  He presented to the ED 08/19/2022 complaining of chest pain for 10 days. This was a left chest pain that was non-radiating, non-exertional, and not pleuritic. He also noted some palpitations that had been an ongoing issue for quite some time. Labs unremarkable; troponin x2 within normal limits and without significant delta. EKG was non-ischemic. His chest pain resolved spontaneously in the ER. He was discharged with hydroxyzine as it was felt his anxiety may be contributing.   On 08/25/2022 he presented to urgent care. His EKG showed NSR at 63 bpm. Labs showed TSH 0.98, LDL 102.  Chest pain: -Initial onset: Ongoing for years. Most recent episode was 2 weeks prior to his ED visit 07/2022. He confirms that his chest pain episodes had never lasted that long in the past. -Quality: Quick, sharp chest pains like sticking by a needle. He motions to random, pinpoint areas of his chest.  -Frequency: Random, unexpected. Years ago it may have occurred every few years, then every year. However they seem to be more regular lately. Since his ED visit his episodes have recurred pretty frequently. -Duration: His last episode lasted 2 weeks.  -Associated symptoms: Rapid palpitations. He notes that he has anxiety and is more self-aware of his heart beats.  Dizziness, but without syncope. Fairly mild nausea. -Aggravating/alleviating factors: He used to smoke cigarettes which was known to cause his chest pain, so he quit smoking. It was also caused with caffeine and marijuana use, which he has cut back.  -Prior cardiac history: When he was younger, about 61 yo, he had rheumatic fever. -Prior ECG: At ED visit 08/19/2022 his ECG was non-ischemic. -Prior treatment:  He does notice improvement with taking Xanax. The medication helps to slow his heart rate and resolve his palpitations. -Alcohol: Up to 2 beers a night. -Tobacco:  Former smoker, he quit 12 years ago. -Exercise level: He works out 5 days a week. He becomes diaphoretic as he expects, but denies any anginal symptoms. -Cardiac ROS: no shortness of breath, no PND, no orthopnea, no LE edema, no syncope -Family history: He denies any history of cardiovascular disease in his family.   Past Medical History:  Diagnosis Date   Diabetes mellitus without complication (HCC)    ETOH abuse    Hypertension     History reviewed. No pertinent surgical history.  Current Medications: Current Outpatient Medications on File Prior to Visit  Medication Sig   ALPRAZolam (XANAX) 0.5 MG tablet Take 0.5 tablets (0.25 mg total) by mouth at bedtime as needed for anxiety.   amLODipine (NORVASC) 10 MG tablet Take 10 mg by mouth daily.   aspirin EC 81 MG tablet Take 2 tablets (162 mg total) by mouth daily.   diclofenac (VOLTAREN) 50 MG EC tablet Take by mouth.  lisinopril (PRINIVIL,ZESTRIL) 20 MG tablet Take 20 mg by mouth daily.   metFORMIN (GLUCOPHAGE) 500 MG tablet Take 500 mg by mouth 2 (two) times daily.   nitroGLYCERIN (NITROSTAT) 0.4 MG SL tablet Place 1 tablet (0.4 mg total) under the tongue every 5 (five) minutes as needed.   simvastatin (ZOCOR) 10 MG tablet Take 10 mg by mouth at bedtime.   No current facility-administered medications on file prior to visit.     Allergies:   Patient has no known  allergies.   Social History   Tobacco Use   Smoking status: Never   Smokeless tobacco: Never  Vaping Use   Vaping Use: Never used  Substance Use Topics   Alcohol use: Yes    Comment: daily   Drug use: Not Currently    Types: Marijuana    Family History: No history of cardiovascular disease  ROS:   Please see the history of present illness.  Additional pertinent ROS: Constitutional: Negative for chills, fever, night sweats, unintentional weight loss  HENT: Negative for ear pain and hearing loss.   Eyes: Negative for loss of vision and eye pain.  Respiratory: Negative for cough, sputum, wheezing.   Cardiovascular: See HPI. Gastrointestinal: Negative for abdominal pain, melena, and hematochezia.  Genitourinary: Negative for dysuria and hematuria.  Musculoskeletal: Negative for falls and myalgias.  Skin: Negative for itching and rash.  Neurological: Negative for focal weakness, focal sensory changes and loss of consciousness. Positive for anxiety. Endo/Heme/Allergies: Does not bruise/bleed easily.     EKGs/Labs/Other Studies Reviewed:    The following studies were reviewed today:  Echo Stress Test  01/01/2018: Study Conclusions  - Procedure narrative: Transthoracic stress echocardiography. The    study was technically limited due to poor acoustic window    availability. Images were captured at baseline and peak exercise.    IV Definity was utilized.  - Stress ECG conclusions: There were no stress arrhythmias or    conduction abnormalities. The stress ECG was normal.  - Staged echo: Normal resting and stress images. EF 55->65% with    appropriate decrease in LV cavity size    No new RWMA;s. There was no echocardiographic evidence for    stress-induced ischemia.  - Impressions: Negative exercise stress echo Patient only achieved    a maximum HR of 125 bpm which decreases    sensitivity of test.   Impressions:  - Negative exercise stress echo Patient only achieved a  maximum HR    of 125 bpm which decreases    sensitivity of test. Normal study after maximal exercise.   Echo  12/31/2017: Study Conclusions  - Left ventricle: The cavity size was normal. Wall thickness was    normal. Systolic function was normal. The estimated ejection    fraction was in the range of 55% to 60%. Wall motion was normal;    there were no regional wall motion abnormalities. Doppler    parameters are consistent with abnormal left ventricular    relaxation (grade 1 diastolic dysfunction).   Impressions:  - Normal LV systolic function; mild diastolic dysfunction.   EKG:  EKG is personally reviewed.   09/14/2022:  sinus bradycardia at 59 bpm  Recent Labs: 08/19/2022: BUN 23; Creatinine, Ser 1.02; Hemoglobin 14.1; Platelets 276; Potassium 3.7; Sodium 141   Recent Lipid Panel No results found for: "CHOL", "TRIG", "HDL", "CHOLHDL", "VLDL", "LDLCALC", "LDLDIRECT"  Physical Exam:    VS:  BP (!) 136/90 (BP Location: Left Arm, Patient Position: Sitting, Cuff Size: Large)  Pulse (!) 59   Ht 6' (1.829 m)   Wt 216 lb 9.6 oz (98.2 kg)   BMI 29.38 kg/m     Wt Readings from Last 3 Encounters:  09/14/22 216 lb 9.6 oz (98.2 kg)  08/19/22 215 lb (97.5 kg)  12/17/21 230 lb (104.3 kg)    GEN: Well nourished, well developed in no acute distress HEENT: Normal, moist mucous membranes NECK: No JVD CARDIAC: regular rhythm, normal S1 and S2, no rubs or gallops. No murmur. VASCULAR: Radial and DP pulses 2+ bilaterally. No carotid bruits RESPIRATORY:  Clear to auscultation without rales, wheezing or rhonchi  ABDOMEN: Soft, non-tender, non-distended MUSCULOSKELETAL:  Ambulates independently SKIN: Warm and dry, no edema NEUROLOGIC:  Alert and oriented x 3. No focal neuro deficits noted. PSYCHIATRIC:  Normal affect    ASSESSMENT:    1. Chest pain of uncertain etiology   2. Heart palpitations   3. Essential hypertension   4. At increased risk for cardiovascular disease   5.  Cardiac risk counseling   6. Counseling on health promotion and disease prevention    PLAN:    Chest pain High ASCVD risk -atypical symptoms but elevated ASCVD risk -discussed options for further evaluation. He has had an unremarkable stress test in the past. After shared decision making, will pursue coronary CTA for anatomic evaluation. -no beta blocker as he is bradycardic at baseline -BMET ordered, reviewed need for nitroglycerin -based on results, discuss aspirin and statin at follow up  Palpitations -we discussed the potential causes of fast heart rates and palpitations today. Reviewed the normal electrical system of the heart. Reviewed the role of the sinus node. Reviewed the balance between resting (vagal) tone and fight or flight nervous system input. Reviewed how exercise improves vagal tone and lowers resting heart rate. Reviewed that sinus tachycardia, or elevated sinus rate, is usually secondary to something else in the body. This can include pain, stress, infection, anxiety, hormone imbalance, low blood counts, etc. Discussed that we do not typically treat sinus tachycardia by itself, and instead the focus is on finding what is driving the heart rate and treating that. We discussed that there can be other rhythm issues, from either the top or bottom of the heart, that are abnormal rhythms. Discussed how we evaluate for these.  -ordered 14 day Zio monitor today, instructed on use  Hypertension -slightly elevated today -continue lisinopril for now  Cardiac risk counseling and prevention recommendations: -recommend heart healthy/Mediterranean diet, with whole grains, fruits, vegetable, fish, lean meats, nuts, and olive oil. Limit salt. -recommend moderate walking, 3-5 times/week for 30-50 minutes each session. Aim for at least 150 minutes.week. Goal should be pace of 3 miles/hours, or walking 1.5 miles in 30 minutes -recommend avoidance of tobacco products. Avoid excess  alcohol. -ASCVD risk score: The 10-year ASCVD risk score (Arnett DK, et al., 2019) is: 23.7%   Values used to calculate the score:     Age: 68 years     Sex: Male     Is Non-Hispanic African American: Yes     Diabetic: Yes     Tobacco smoker: No     Systolic Blood Pressure: [id number] mmHg     Is BP treated: Yes     HDL Cholesterol: 75 MG/DL     Total Cholesterol: 196 MG/DL    Plan for follow up: 4-5 weeks to review testing results, or sooner as needed.  Buford Dresser, MD, PhD, [facility]    Medication  Adjustments/Labs and Tests Ordered: Current medicines are reviewed at length with the patient today.  Concerns regarding medicines are outlined above.   Orders Placed This Encounter  Procedures   CT CORONARY MORPH W/CTA COR W/SCORE W/CA W/CM &/OR WO/CM   Basic metabolic panel   LONG TERM MONITOR (3-14 DAYS)   EKG 12-Lead   No orders of the defined types were placed in this encounter.  Patient Instructions  Medication Instructions:  Your physician recommends that you continue on your current medications as directed. Please refer to the Current Medication list given to you today.   Labwork: BMET 1 WEEK PRIOR TO CT   Testing/Procedures: Your physician has requested that you have cardiac CT. Cardiac computed tomography (CT) is a painless test that uses an x-ray machine to take clear, detailed pictures of your heart. For further information please visit [employer]. Please follow instruction sheet as given.  14 day zio   Follow-Up: 4-5 weeks with Dr Harrell Gave   Any Other Special Instructions Will Be Listed Below (If Applicable).    Your cardiac CT will be scheduled at one of the below locations:   [facility] [address] [place], [place] [address] (336) [place] [address] [place], [place] [address] [phone]  [place], [place] [address] [phone]  If scheduled at [facility], please arrive at the [facility] and Children's Entrance (Entrance C2) of [facility] 30 minutes prior to test start time. You can use the FREE valet parking offered at entrance C (encouraged to control the heart rate for the test)  Proceed to the [facility] Radiology Department (first floor) to check-in and test prep.  All radiology patients and guests should use entrance C2 at [facility], accessed from [facility], even though the hospital's physical address listed is [address]    If scheduled at [facility] or [facility], please arrive 15 mins early for check-in and test prep.  Please follow these instructions carefully (unless otherwise directed):  Hold all erectile dysfunction medications at least 3 days (72 hrs) prior to test. (Ie viagra, cialis, sildenafil, tadalafil, etc) We will administer nitroglycerin during this exam.   On the Night Before the Test: Be sure to Drink plenty of water. Do not consume any caffeinated/decaffeinated beverages or chocolate 12 hours prior to your test. Do not take any antihistamines 12 hours prior to your test.  On the Day of the Test: Drink plenty of water until 1 hour prior to the test. Do not eat any food 1 hour prior to test. You may take your regular medications prior to the test.  Take metoprolol (Lopressor) two hours prior to test. HOLD Furosemide/Hydrochlorothiazide morning of the test.      After the Test: Drink plenty of water. After receiving IV contrast, you may experience a mild flushed feeling. This is normal. On occasion, you may experience a mild rash up to 24 hours after the test. This is not dangerous. If this occurs, you can take Benadryl 25 mg and increase your fluid intake. If you experience trouble breathing, this can be serious. If it  is severe call 911 IMMEDIATELY. If it is mild, please call our office. If you take any of these medications: Glipizide/Metformin, Avandament, Glucavance, please do not take 48 hours after completing test unless otherwise instructed.  We  will call to schedule your test 2-4 weeks out understanding that some insurance companies will need an authorization prior to the service being performed.   For non-scheduling related questions, please contact the cardiac imaging nurse navigator should you have any questions/concerns: Marchia Bond, Cardiac Imaging Nurse Navigator Gordy Clement, Cardiac Imaging Nurse Navigator [facility] Direct Office Dial: [phone]   For scheduling needs, including cancellations and rescheduling, please call [place], [phone].    I,Mathew Stumpf,acting as a Education administrator for [employer], MD.,have documented all relevant documentation on the behalf of Buford Dresser, MD,as directed by  Buford Dresser, MD while in the presence of Buford Dresser, MD.  I, Buford Dresser, MD, have reviewed all documentation for this visit. The documentation on 09/14/22 for the exam, diagnosis, procedures, and orders are all accurate and complete.   Signed, Buford Dresser, MD PhD 09/14/2022 1:27 PM    [facility]

## 2022-09-28 ENCOUNTER — Telehealth (HOSPITAL_COMMUNITY): Payer: Self-pay | Admitting: *Deleted

## 2022-09-28 NOTE — Telephone Encounter (Signed)
Reaching out to patient to offer assistance regarding upcoming cardiac imaging study; pt verbalizes understanding of appt date/time, parking situation and where to check in,  and verified current allergies; name and call back number provided for further questions should they arise  Christian Brick RN Navigator Cardiac Imaging [facility] [phone] office [phone] cell  Patient aware to arrive at 8am.

## 2022-09-29 ENCOUNTER — Ambulatory Visit (HOSPITAL_COMMUNITY)
Admission: RE | Admit: 2022-09-29 | Discharge: 2022-09-29 | Disposition: A | Discharge status: Home / Self Care | Payer: BC Managed Care – PPO | Source: Ambulatory Visit | Attending: Cardiology | Admitting: Cardiology

## 2022-09-29 DIAGNOSIS — R079 Chest pain, unspecified: Secondary | ICD-10-CM | POA: Diagnosis not present

## 2022-09-29 DIAGNOSIS — R002 Palpitations: Secondary | ICD-10-CM | POA: Diagnosis present

## 2022-09-29 DIAGNOSIS — I10 Essential (primary) hypertension: Secondary | ICD-10-CM

## 2022-09-29 MED ORDER — NITROGLYCERIN 0.4 MG SL SUBL
SUBLINGUAL_TABLET | SUBLINGUAL | Status: AC
  Filled 2022-09-29: qty 2

## 2022-09-29 MED ORDER — NITROGLYCERIN 0.4 MG SL SUBL
0.8000 mg | SUBLINGUAL_TABLET | Freq: Once | SUBLINGUAL | Status: AC
  Administered 2022-09-29: 0.8 mg via SUBLINGUAL

## 2022-09-29 MED ORDER — IOHEXOL 350 MG/ML SOLN
95.0000 mL | Freq: Once | INTRAVENOUS | Status: AC | PRN
  Administered 2022-09-29: 95 mL via INTRAVENOUS

## 2022-10-09 NOTE — Progress Notes (Signed)
Cardiology Office Note:    Date:  10/11/2022   ID:  Christian Aguirre, DOB 10-05-1961, MRN [id number]  PCP:  [facility]  Cardiologist:  Jodelle Red, MD  Referring MD: No ref. provider found   CC: Follow-up for chest pain  History of Present Illness:    Christian Aguirre is a 61 y.o. male with a hx of hypertension, type 2 diabetes, and ETOH abuse, who is seen for follow-up today. He was initially seen 09/14/2022 as a new consult for the evaluation and management of chest pain and palpitations.  Previously seen in cardiology 12/17/2017 by Dr. Tomie China for atypical chest pain. He was started on prn nitroglycerin, and daily 162 mg enteric-coated aspirin.  Prior cardiac history: When he was younger, about 61 yo, he had rheumatic fever. Prior treatment:  He does notice improvement with taking Xanax. The medication helps to slow his heart rate and resolve his palpitations. Alcohol: Up to 2 beers a night. Tobacco:  Former smoker, he quit 12 years ago. Exercise level: He works out 5 days a week. He becomes diaphoretic as he expects, but denies any anginal symptoms. Family history: He denies any history of cardiovascular disease in his family.  At his last visit we pursued workup including coronary CTA and 14-day Zio monitor. 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 Also noted were mild aortic valve calcifications (AV calcium score 120). His monitor showed predominantly sinus rhythm with 1 run of supraventricular tachycardia lasting 4 beats. Very rare early beats (<1%). No VT, Afib, high degree block, or pauses.   Today, we reviewed the results of his coronary CTA and monitor at length.  Since his last visit he continues to experience chest pain. He describes this as an "uncomfortable feeling" in his left chest, but not a sharp pain. The chest  pain is occurring 2-3 times a day, with a typical duration of 1-3 seconds. The only time it lasted longer was prior to his ER visit in October.  For exercise, he is jogging 5 times a week without exacerbation of his chest pain. He is also watching his diet.  In clinic his BP is 157/94, but improved to 128/82 on recheck. He notes that his blood pressure is always up and down, especially when coming to clinic visits. Normally at home his blood pressures are averaging 120-135 systolic.  He denies any shortness of breath, or peripheral edema. No lightheadedness, headaches, syncope, orthopnea, or PND.   Past Medical History:  Diagnosis Date   Diabetes mellitus without complication (HCC)    ETOH abuse    Hypertension     History reviewed. No pertinent surgical history.  Current Medications: Current Outpatient Medications on File Prior to Visit  Medication Sig   ALPRAZolam (XANAX) 0.5 MG tablet Take 0.5 tablets (0.25 mg total) by mouth at bedtime as needed for anxiety.   amLODipine (NORVASC) 10 MG tablet Take 10 mg by mouth daily.   lisinopril (PRINIVIL,ZESTRIL) 20 MG tablet Take 20 mg by mouth daily.   metFORMIN (GLUCOPHAGE) 1000 MG tablet Take 1,000 mg by mouth daily with breakfast.   simvastatin (ZOCOR) 10 MG tablet Take 10 mg by mouth at bedtime.   nitroGLYCERIN (NITROSTAT) 0.4 MG SL tablet Place 1 tablet (0.4 mg total) under the tongue every 5 (five) minutes as needed.   No current facility-administered medications on file prior to visit.  Allergies:   Patient has no known allergies.   Social History   Tobacco Use   Smoking status: Never   Smokeless tobacco: Never  Vaping Use   Vaping Use: Never used  Substance Use Topics   Alcohol use: Yes    Comment: daily   Drug use: Not Currently    Types: Marijuana    Family History: No history of cardiovascular disease  ROS:   Please see the history of present illness. (+) Chest pain All other systems are reviewed and  negative.    EKGs/Labs/Other Studies Reviewed:    The following studies were reviewed today:  Zio Monitor  09/2022: Patch Wear Time:  13 days and 20 hours   Patient had a min HR of 52 bpm, max HR of 156 bpm, and avg HR of 79 bpm. Predominant underlying rhythm was Sinus Rhythm. 1 run of Supraventricular Tachycardia occurred lasting 4 beats with a max rate of 128 bpm (avg 123 bpm). No VT, atrial fibrillation, high degree block, or pauses noted. Isolated atrial and ventricular ectopy was rare (<1%). There were 7 patient triggered events. These were sinus with a PVC. No significant arrhythmias detected.   Coronary CTA  09/29/2022: IMPRESSION: 1. Coronary calcium score of 7. This was 59th percentile for age and sex matched control.   2.  Normal coronary origin with codominance.   3. Nonobstructive CAD, with calcified plaque in the proximal LAD causing minimal (0-24%) stenosis   4.  Mild aortic valve calcifications (AV calcium score 120)   CAD-RADS 1. Minimal non-obstructive CAD (0-24%). Consider non-atherosclerotic causes of chest pain. Consider preventive therapy and risk factor modification.  Echo Stress Test  01/01/2018: Study Conclusions  - Procedure narrative: Transthoracic stress echocardiography. The    study was technically limited due to poor acoustic window    availability. Images were captured at baseline and peak exercise.    IV Definity was utilized.  - Stress ECG conclusions: There were no stress arrhythmias or    conduction abnormalities. The stress ECG was normal.  - Staged echo: Normal resting and stress images. EF 55->65% with    appropriate decrease in LV cavity size    No new RWMA;s. There was no echocardiographic evidence for    stress-induced ischemia.  - Impressions: Negative exercise stress echo Patient only achieved    a maximum HR of 125 bpm which decreases    sensitivity of test.   Impressions:  - Negative exercise stress echo Patient only achieved a  maximum HR    of 125 bpm which decreases    sensitivity of test. Normal study after maximal exercise.   Echo  12/31/2017: Study Conclusions  - Left ventricle: The cavity size was normal. Wall thickness was    normal. Systolic function was normal. The estimated ejection    fraction was in the range of 55% to 60%. Wall motion was normal;    there were no regional wall motion abnormalities. Doppler    parameters are consistent with abnormal left ventricular    relaxation (grade 1 diastolic dysfunction).   Impressions:  - Normal LV systolic function; mild diastolic dysfunction.   EKG:  EKG is personally reviewed.   10/10/2022:  EKG was not ordered. 09/14/2022:  sinus bradycardia at 59 bpm  Recent Labs: 08/19/2022: BUN 23; Creatinine, Ser 1.02; Hemoglobin 14.1; Platelets 276; Potassium 3.7; Sodium 141   Recent Lipid Panel No results found for: "CHOL", "TRIG", "HDL", "CHOLHDL", "VLDL", "LDLCALC", "LDLDIRECT"  Physical Exam:    VS:  BP 128/82 (BP Location: Left Arm, Patient Position: Sitting, Cuff Size: Normal)   Pulse 76   Ht 6' (1.829 m)   Wt 219 lb 3.2 oz (99.4 kg)   SpO2 100%   BMI 29.73 kg/m     Wt Readings from Last 3 Encounters:  10/10/22 219 lb 3.2 oz (99.4 kg)  09/14/22 216 lb 9.6 oz (98.2 kg)  08/19/22 215 lb (97.5 kg)    GEN: Well nourished, well developed in no acute distress HEENT: Normal, moist mucous membranes NECK: No JVD CARDIAC: regular rhythm, normal S1 and S2, no rubs or gallops. No murmur. VASCULAR: Radial and DP pulses 2+ bilaterally. No carotid bruits RESPIRATORY:  Clear to auscultation without rales, wheezing or rhonchi  ABDOMEN: Soft, non-tender, non-distended MUSCULOSKELETAL:  Ambulates independently SKIN: Warm and dry, no edema NEUROLOGIC:  Alert and oriented x 3. No focal neuro deficits noted. PSYCHIATRIC:  Normal affect    ASSESSMENT:    1. Chest pain, atypical   2. Nonobstructive atherosclerosis of coronary artery   3. Essential  hypertension   4. Heart palpitations   5. Encounter to discuss test results    PLAN:    Chest pain High ASCVD risk -atypical symptoms but elevated ASCVD risk -CT shows nonobstructive CAD--not the etiology of his symptoms, but discussed  recommendations for aspirin and statin -reviewed red flag warning signs that need immediate medical attention  Palpitations -reviewed Zio, overall very reassuring, he is sensitive to single PVCs but these are rare  Hypertension -continue lisinopril  Cardiac risk counseling and prevention recommendations: -recommend heart healthy/Mediterranean diet, with whole grains, fruits, vegetable, fish, lean meats, nuts, and olive oil. Limit salt. -recommend moderate walking, 3-5 times/week for 30-50 minutes each session. Aim for at least 150 minutes.week. Goal should be pace of 3 miles/hours, or walking 1.5 miles in 30 minutes -recommend avoidance of tobacco products. Avoid excess alcohol. -ASCVD risk score: The 10-year ASCVD risk score (Arnett DK, et al., 2019) is: 21.4%   Values used to calculate the score:     Age: 56 years     Sex: Male     Is Non-Hispanic African American: Yes     Diabetic: Yes     Tobacco smoker: No     Systolic Blood Pressure: 128 mmHg     Is BP treated: Yes     HDL Cholesterol: 75 MG/DL     Total Cholesterol: 196 MG/DL    Plan for follow up: 1 year, or sooner as needed.  Jodelle Red, MD, PhD, [facility] [place]  [facility]    Medication Adjustments/Labs and Tests Ordered: Current medicines are reviewed at length with the patient today.  Concerns regarding medicines are outlined above.   No orders of the defined types were placed in this encounter.  Meds ordered this encounter  Medications   aspirin EC 81 MG tablet    Sig: Take 1 tablet (81 mg total) by mouth daily. Swallow whole.    Dispense:  90 tablet    Refill:  3   Patient Instructions  Medication Instructions:  Your Physician recommend you continue  on your current medication as directed.    *If you need a refill on your cardiac medications before your next appointment, please call your pharmacy*  Follow-Up: At [facility], you and your health needs are our priority.  As part of our continuing mission to provide you with exceptional heart care, we have created designated Provider Care Teams.  These Care Teams include your primary  Cardiologist (physician) and Advanced Practice Providers (APPs -  Physician Assistants and Nurse Practitioners) who all work together to provide you with the care you need, when you need it.  We recommend signing up for the patient portal called "MyChart".  Sign up information is provided on this After Visit Summary.  MyChart is used to connect with patients for Virtual Visits (Telemedicine).  Patients are able to view lab/test results, encounter notes, upcoming appointments, etc.  Non-urgent messages can be sent to your provider as well.   To learn more about what you can do with MyChart, go to [email or web address].    Your next appointment:   1 year(s)  The format for your next appointment:   In Person  Provider:   Jodelle Red, MD     [facility],acting as a scribe for Jodelle Red, MD.,have documented all relevant documentation on the behalf of Jodelle Red, MD,as directed by  Jodelle Red, MD while in the presence of Jodelle Red, MD.  I, Jodelle Red, MD, have reviewed all documentation for this visit. The documentation on 10/11/22 for the exam, diagnosis, procedures, and orders are all accurate and complete.   Signed, Jodelle Red, MD PhD 10/11/2022 3:51 PM    [facility]

## 2022-10-10 ENCOUNTER — Encounter (HOSPITAL_BASED_OUTPATIENT_CLINIC_OR_DEPARTMENT_OTHER): Payer: Self-pay | Admitting: Cardiology

## 2022-10-10 ENCOUNTER — Ambulatory Visit (INDEPENDENT_AMBULATORY_CARE_PROVIDER_SITE_OTHER): Payer: BC Managed Care – PPO | Admitting: Cardiology

## 2022-10-10 VITALS — BP 128/82 | HR 76 | Ht 72.0 in | Wt 219.2 lb

## 2022-10-10 DIAGNOSIS — R0789 Other chest pain: Secondary | ICD-10-CM | POA: Diagnosis not present

## 2022-10-10 DIAGNOSIS — I2510 Atherosclerotic heart disease of native coronary artery without angina pectoris: Secondary | ICD-10-CM

## 2022-10-10 DIAGNOSIS — R002 Palpitations: Secondary | ICD-10-CM | POA: Diagnosis not present

## 2022-10-10 DIAGNOSIS — Z712 Person consulting for explanation of examination or test findings: Secondary | ICD-10-CM

## 2022-10-10 DIAGNOSIS — I10 Essential (primary) hypertension: Secondary | ICD-10-CM | POA: Diagnosis not present

## 2022-10-10 MED ORDER — ASPIRIN 81 MG PO TBEC: 81.0000 mg | DELAYED_RELEASE_TABLET | Freq: Every day | ORAL | 3 refills | Status: DC

## 2022-10-10 NOTE — Patient Instructions (Signed)
Medication Instructions:  Your Physician recommend you continue on your current medication as directed.    *If you need a refill on your cardiac medications before your next appointment, please call your pharmacy*  Follow-Up: At  HeartCare, you and your health needs are our priority.  As part of our continuing mission to provide you with exceptional heart care, we have created designated Provider Care Teams.  These Care Teams include your primary Cardiologist (physician) and Advanced Practice Providers (APPs -  Physician Assistants and Nurse Practitioners) who all work together to provide you with the care you need, when you need it.  We recommend signing up for the patient portal called "MyChart".  Sign up information is provided on this After Visit Summary.  MyChart is used to connect with patients for Virtual Visits (Telemedicine).  Patients are able to view lab/test results, encounter notes, upcoming appointments, etc.  Non-urgent messages can be sent to your provider as well.   To learn more about what you can do with MyChart, go to [email or web address].    Your next appointment:   1 year(s)  The format for your next appointment:   In Person  Provider:   Bridgette Christopher, MD  

## 2022-10-11 ENCOUNTER — Encounter (HOSPITAL_BASED_OUTPATIENT_CLINIC_OR_DEPARTMENT_OTHER): Payer: Self-pay | Admitting: Cardiology

## 2023-05-01 ENCOUNTER — Other Ambulatory Visit: Payer: Self-pay

## 2023-05-01 ENCOUNTER — Emergency Department (HOSPITAL_BASED_OUTPATIENT_CLINIC_OR_DEPARTMENT_OTHER)
Admission: EM | Admit: 2023-05-01 | Discharge: 2023-05-01 | Disposition: A | Discharge status: Home / Self Care | Payer: BC Managed Care – PPO | Attending: Emergency Medicine | Admitting: Emergency Medicine

## 2023-05-01 ENCOUNTER — Emergency Department (HOSPITAL_BASED_OUTPATIENT_CLINIC_OR_DEPARTMENT_OTHER): Payer: BC Managed Care – PPO | Admitting: Radiology

## 2023-05-01 ENCOUNTER — Encounter (HOSPITAL_BASED_OUTPATIENT_CLINIC_OR_DEPARTMENT_OTHER): Payer: Self-pay

## 2023-05-01 DIAGNOSIS — R0789 Other chest pain: Secondary | ICD-10-CM | POA: Diagnosis present

## 2023-05-01 DIAGNOSIS — Z7982 Long term (current) use of aspirin: Secondary | ICD-10-CM | POA: Diagnosis not present

## 2023-05-01 LAB — CBC
HCT: 44.4 % (ref 39.0–52.0)
Hemoglobin: 14.9 g/dL (ref 13.0–17.0)
MCH: 30 pg (ref 26.0–34.0)
MCHC: 33.6 g/dL (ref 30.0–36.0)
MCV: 89.3 fL (ref 80.0–100.0)
Platelets: 270 10*3/uL (ref 150–400)
RBC: 4.97 MIL/uL (ref 4.22–5.81)
RDW: 12.9 % (ref 11.5–15.5)
WBC: 5.8 10*3/uL (ref 4.0–10.5)
nRBC: 0 % (ref 0.0–0.2)

## 2023-05-01 LAB — TROPONIN I (HIGH SENSITIVITY)
Troponin I (High Sensitivity): 4 ng/L (ref <18)
Troponin I (High Sensitivity): 4 ng/L (ref <18)

## 2023-05-01 LAB — BASIC METABOLIC PANEL
Anion gap: 12 (ref 5–15)
BUN: 12 mg/dL (ref 8–23)
CO2: 26 mmol/L (ref 22–32)
Calcium: 10.2 mg/dL (ref 8.9–10.3)
Chloride: 102 mmol/L (ref 98–111)
Creatinine, Ser: 0.93 mg/dL (ref 0.61–1.24)
GFR, Estimated: >60 mL/min (ref >60)
Glucose, Bld: 102 mg/dL — ABNORMAL HIGH (ref 70–99)
Potassium: 3.7 mmol/L (ref 3.5–5.1)
Sodium: 140 mmol/L (ref 135–145)

## 2023-05-01 LAB — D-DIMER, QUANTITATIVE: D-Dimer, Quant: <0.27 ug/mL-FEU (ref 0.00–0.50)

## 2023-05-01 NOTE — Discharge Instructions (Addendum)
You are seen in the emergency department for chest pain.  Your labs are all unremarkable without any evidence of a heart attack or blood clot present.  I would recommend following up with her cardiologist again for further evaluation.  If you have any acute worsening of your symptoms, please return to the emergency department for further evaluation.

## 2023-05-01 NOTE — ED Provider Notes (Signed)
[place] EMERGENCY DEPARTMENT AT [facility] Provider Note   CSN: [id number] Arrival date & time: 05/01/23  1436     History Chief Complaint  Patient presents with   Chest Pain    Christian Aguirre is a 62 y.o. male.  Patient presents to the emergency department complaints of chest pain.  Reports that he has been evaluated previously for similar symptoms has been seen by cardiology with CT angiography as well as Holter monitor placement for assessment of potential cause of symptoms.  Describes the pain as center chest in nature with radiation into his left neck as well as his left arm.  States this is typically temporary and has previously been told this may be related to anxiety but feels that his anxiety medication is helping his pain.  Denies any nausea, vomiting, diaphoresis.  Denies shortness of breath.  After initial assessment of patient, he does endorse that he has a history of rheumatic fever as a child.   Chest Pain      Home Medications Prior to Admission medications   Medication Sig Start Date End Date Taking? Authorizing Provider  ALPRAZolam Prudy Feeler) 0.5 MG tablet Take 0.5 tablets (0.25 mg total) by mouth at bedtime as needed for anxiety. 12/17/17   Revankar, Aundra Dubin, MD  amLODipine (NORVASC) 10 MG tablet Take 10 mg by mouth daily.    [provider]  aspirin EC 81 MG tablet Take 1 tablet (81 mg total) by mouth daily. Swallow whole. 10/10/22   Jodelle Red, MD  lisinopril (PRINIVIL,ZESTRIL) 20 MG tablet Take 20 mg by mouth daily.    [provider]  metFORMIN (GLUCOPHAGE) 1000 MG tablet Take 1,000 mg by mouth daily with breakfast.    [provider]  nitroGLYCERIN (NITROSTAT) 0.4 MG SL tablet Place 1 tablet (0.4 mg total) under the tongue every 5 (five) minutes as needed. 12/17/17 09/14/22  Revankar, Aundra Dubin, MD  simvastatin (ZOCOR) 10 MG tablet Take 10 mg by mouth at bedtime.    [provider]      Allergies     Patient has no known allergies.    Review of Systems   Review of Systems  Cardiovascular:  Positive for chest pain.  All other systems reviewed and are negative.   Physical Exam Updated Vital Signs BP (!) 151/94 (BP Location: Right Arm)   Pulse 66   Temp 98 F (36.7 C) (Oral)   Resp 16   Ht 6' (1.829 m)   Wt 102.1 kg   SpO2 100%   BMI 30.52 kg/m  Physical Exam Vitals and nursing note reviewed.  Constitutional:      General: He is not in acute distress.    Appearance: He is well-developed.  HENT:     Head: Normocephalic and atraumatic.  Eyes:     Conjunctiva/sclera: Conjunctivae normal.  Cardiovascular:     Rate and Rhythm: Normal rate and regular rhythm.     Heart sounds: Normal heart sounds. No murmur heard.    No systolic murmur is present.     No diastolic murmur is present.  Pulmonary:     Effort: Pulmonary effort is normal. No respiratory distress.     Breath sounds: Normal breath sounds. No decreased breath sounds or wheezing.  Abdominal:     Palpations: Abdomen is soft.     Tenderness: There is no abdominal tenderness.  Musculoskeletal:        General: No swelling.     Cervical back: Neck supple.  Skin:    General: Skin is warm and dry.     Capillary Refill: Capillary refill takes less than 2 seconds.  Neurological:     Mental Status: He is alert.  Psychiatric:        Mood and Affect: Mood normal.     ED Results / Procedures / Treatments   Labs (all labs ordered are listed, but only abnormal results are displayed) Labs Reviewed  BASIC METABOLIC PANEL - Abnormal; Notable for the following components:      Result Value   Glucose, Bld 102 (*)    All other components within normal limits  CBC  D-DIMER, QUANTITATIVE  TROPONIN I (HIGH SENSITIVITY)  TROPONIN I (HIGH SENSITIVITY)    EKG None  Radiology DG Chest 2 View  Result Date: 05/01/2023 CLINICAL DATA:  Acute chest pain. EXAM: CHEST - 2 VIEW COMPARISON:  08/19/2022 and prior studies  FINDINGS: The cardiomediastinal silhouette is unremarkable. There is no evidence of focal airspace disease, pulmonary edema, suspicious pulmonary nodule/mass, pleural effusion, or pneumothorax. No acute bony abnormalities are identified. IMPRESSION: No active cardiopulmonary disease. Electronically Signed   By: Harmon Pier M.D.   On: 05/01/2023 16:13    Procedures Procedures   Medications Ordered in ED Medications - No data to display  ED Course/ Medical Decision Making/ A&P                           Medical Decision Making Amount and/or Complexity of Data Reviewed Labs: ordered. Radiology: ordered.   This patient presents to the ED for concern of chest pain.  Differential diagnosis includes ACS, MI, pulmonary embolism, bronchitis, pneumonia   Lab Tests:  I Ordered, and personally interpreted labs.  The pertinent results include: CBC and BMP unremarkable, troponin negative, delta troponin negative, D-dimer negative   Imaging Studies ordered:  I ordered imaging studies including chest x-ray I independently visualized and interpreted imaging which showed no acute cardiopulmonary process I agree with the radiologist interpretation   Problem List / ED Course:  Patient presented to the emergency department complaints of chest pain.  Reports this been ongoing issue off and on for several months at this point.  Has previously been evaluated by cardiology and reports that he has been told that he has no acute abnormality noted on imaging or cardiac monitoring.  Patient currently denying any chest pain he feels that this is now resolved although this been present off and on for the last week or so.  Also endorsing some shortness of breath and mild dizziness.  Will perform cardiac workup for further evaluation of patient's symptoms. CBC, BMP, troponin, D-dimer all negative at this time.  EKG shows normal sinus rhythm without any acute changes or any evidence of ST elevation or ST depression.   Chest x-ray is also unremarkable.  Informed patient of findings being all reassuring at this time.  With discussion with patient, he does report to me that he had a history of rheumatic fever as a child but denies any prior history of being told any acute abnormalities with his heart.  I cannot appreciate any obvious heart murmurs on auscultation.  Advised patient to make sure that he makes his cardiologist aware of his history of rheumatic fever as he reports that they are not aware of this past history.  Encourage patient to return to cardiology for further evaluation given concerns of intermittent chest pain not particularly caused by exertion or any precipitating symptoms.  Patient is currently dynamically stable for discharge home.  Patient is agreeable with treatment plan verbalized understanding all return precautions.  All questions answered prior to patient discharge.  Patient discharged home in good condition with ambulatory referral to cardiology placed.  Final Clinical Impression(s) / ED Diagnoses Final diagnoses:  Chest pain, atypical    Rx / DC Orders ED Discharge Orders          Ordered    Ambulatory referral to Cardiology       Comments: If you have not heard from the Cardiology office within the next 72 hours please call [phone].   05/01/23 1856              Smitty Knudsen, PA-C 05/01/23 Harriet Pho, MD 05/01/23 1930

## 2023-05-01 NOTE — ED Triage Notes (Signed)
Patient here POV from Home.  Endorses Pain to left Chest and Left Jaw (Tingling in [place]) that began 1 Week ago. Mostly Intermittent.  Some SOB and Dizziness. No Fevers or Cough noted.   NAD Noted during Triage. A&Ox4. GCS 15. Ambulatory.

## 2023-05-01 NOTE — ED Notes (Addendum)
Trula Ore, RN in room to discharge patient.

## 2023-05-11 ENCOUNTER — Encounter (HOSPITAL_BASED_OUTPATIENT_CLINIC_OR_DEPARTMENT_OTHER): Payer: Self-pay

## 2023-06-13 ENCOUNTER — Encounter (HOSPITAL_COMMUNITY): Payer: Self-pay

## 2023-06-13 ENCOUNTER — Emergency Department (HOSPITAL_COMMUNITY)
Admission: EM | Admit: 2023-06-13 | Discharge: 2023-06-13 | Disposition: A | Discharge status: Other Acute Inpatient Hospital | Payer: BC Managed Care – PPO | Attending: Emergency Medicine | Admitting: Emergency Medicine

## 2023-06-13 ENCOUNTER — Other Ambulatory Visit: Payer: Self-pay

## 2023-06-13 ENCOUNTER — Emergency Department (HOSPITAL_COMMUNITY): Payer: BC Managed Care – PPO

## 2023-06-13 DIAGNOSIS — X58XXXA Exposure to other specified factors, initial encounter: Secondary | ICD-10-CM | POA: Insufficient documentation

## 2023-06-13 DIAGNOSIS — Z7982 Long term (current) use of aspirin: Secondary | ICD-10-CM | POA: Diagnosis not present

## 2023-06-13 DIAGNOSIS — Z23 Encounter for immunization: Secondary | ICD-10-CM | POA: Diagnosis not present

## 2023-06-13 DIAGNOSIS — S0562XA Penetrating wound without foreign body of left eyeball, initial encounter: Secondary | ICD-10-CM | POA: Insufficient documentation

## 2023-06-13 DIAGNOSIS — S0592XA Unspecified injury of left eye and orbit, initial encounter: Secondary | ICD-10-CM | POA: Diagnosis present

## 2023-06-13 MED ORDER — IBUPROFEN 400 MG PO TABS
600.0000 mg | ORAL_TABLET | Freq: Once | ORAL | Status: AC
  Administered 2023-06-13: 600 mg via ORAL
  Filled 2023-06-13: qty 1

## 2023-06-13 MED ORDER — FLUORESCEIN SODIUM 1 MG OP STRP
1.0000 | ORAL_STRIP | Freq: Once | OPHTHALMIC | Status: AC
  Administered 2023-06-13: 1 via OPHTHALMIC
  Filled 2023-06-13: qty 1

## 2023-06-13 MED ORDER — TETANUS-DIPHTH-ACELL PERTUSSIS 5-2.5-18.5 LF-MCG/0.5 IM SUSY
0.5000 mL | PREFILLED_SYRINGE | Freq: Once | INTRAMUSCULAR | Status: AC
  Administered 2023-06-13: 0.5 mL via INTRAMUSCULAR
  Filled 2023-06-13: qty 0.5

## 2023-06-13 MED ORDER — TETRACAINE HCL 0.5 % OP SOLN
2.0000 [drp] | Freq: Once | OPHTHALMIC | Status: AC
  Administered 2023-06-13: 2 [drp] via OPHTHALMIC
  Filled 2023-06-13: qty 4

## 2023-06-13 MED ORDER — TETANUS-DIPHTH-ACELL PERTUSSIS 5-2.5-18.5 LF-MCG/0.5 IM SUSY: 0.5000 mL | PREFILLED_SYRINGE | Freq: Once | INTRAMUSCULAR | Status: DC

## 2023-06-13 MED ORDER — OXYCODONE-ACETAMINOPHEN 5-325 MG PO TABS
1.0000 | ORAL_TABLET | Freq: Once | ORAL | Status: AC
  Administered 2023-06-13: 1 via ORAL
  Filled 2023-06-13: qty 1

## 2023-06-13 MED ORDER — MORPHINE SULFATE (PF) 4 MG/ML IV SOLN
4.0000 mg | Freq: Once | INTRAVENOUS | Status: AC
  Administered 2023-06-13: 4 mg via INTRAVENOUS
  Filled 2023-06-13: qty 1

## 2023-06-13 MED ORDER — HYDROMORPHONE HCL 1 MG/ML IJ SOLN
1.0000 mg | Freq: Once | INTRAMUSCULAR | Status: AC
  Administered 2023-06-13: 1 mg via INTRAVENOUS
  Filled 2023-06-13: qty 1

## 2023-06-13 MED ORDER — ONDANSETRON HCL 4 MG/2ML IJ SOLN
4.0000 mg | Freq: Once | INTRAMUSCULAR | Status: AC
  Administered 2023-06-13: 4 mg via INTRAVENOUS
  Filled 2023-06-13: qty 2

## 2023-06-13 NOTE — Consult Note (Signed)
CC: No chief complaint on file.   HPI: Christian Aguirre is a 62 y.o. male w/o significant POH here for foreign body. Cutting wire around 3:30 PM when occurred. Can see light and HM. + Pain. Got tetanus in ED today.  ROS: As per HPI  PMH: Past Medical History:  Diagnosis Date   Diabetes mellitus without complication (HCC)    ETOH abuse    Hypertension     PSH: No past surgical history on file.  Meds: No current facility-administered medications on file prior to encounter.   Current Outpatient Medications on File Prior to Encounter  Medication Sig Dispense Refill   aspirin EC 81 MG tablet Take 1 tablet (81 mg total) by mouth daily. Swallow whole. 90 tablet 3   ALPRAZolam (XANAX) 0.5 MG tablet Take 0.5 tablets (0.25 mg total) by mouth at bedtime as needed for anxiety. (Patient not taking: Reported on 06/13/2023) 10 tablet 0   amLODipine (NORVASC) 10 MG tablet Take 10 mg by mouth daily.     lisinopril (PRINIVIL,ZESTRIL) 20 MG tablet Take 20 mg by mouth daily.     metFORMIN (GLUCOPHAGE) 1000 MG tablet Take 1,000 mg by mouth daily with breakfast.     nitroGLYCERIN (NITROSTAT) 0.4 MG SL tablet Place 1 tablet (0.4 mg total) under the tongue every 5 (five) minutes as needed. 11 tablet 6   simvastatin (ZOCOR) 10 MG tablet Take 10 mg by mouth at bedtime.      SH: Social History   Socioeconomic History   Marital status: Single    Spouse name: Not on file   Number of children: Not on file   Years of education: Not on file   Highest education level: Not on file  Occupational History   Not on file  Tobacco Use   Smoking status: Never   Smokeless tobacco: Never  Vaping Use   Vaping status: Never Used  Substance and Sexual Activity   Alcohol use: Yes    Comment: daily   Drug use: Not Currently    Types: Marijuana    Comment: Occ   Sexual activity: Yes  Other Topics Concern   Not on file  Social History Narrative   Not on file   Social Determinants of Health   Financial  Resource Strain: Low Risk  (09/14/2022)   Overall Financial Resource Strain (CARDIA)    Difficulty of Paying Living Expenses: Not hard at all  Food Insecurity: No Food Insecurity (09/14/2022)   Hunger Vital Sign    Worried About Running Out of Food in the Last Year: Never true    Ran Out of Food in the Last Year: Never true  Transportation Needs: No Transportation Needs (09/14/2022)   PRAPARE - Administrator, Civil Service (Medical): No    Lack of Transportation (Non-Medical): No  Physical Activity: Sufficiently Active (09/14/2022)   Exercise Vital Sign    Days of Exercise per Week: 5 days    Minutes of Exercise per Session: 30 min  Stress: Not on file  Social Connections: Not on file    FH: No family history on file.  Exam:  Zenaida Niece: OD: 20/400 eq OS: HM  CVF: OD: full OS: HM  EOM: OD: full d/v OS: full d/v  Pupils: OD: 3->2 mm, unable to assess for APD - Hallway exam OS: 3 irregular, unable to assess for APD - Hallway exam  IOP: by [place] OD: 25 OS: deferred  External: OD: no periorbital edema, no proptosis, good orbicularis strength  OS: no lid lacerations  Hertel:  Pen Light Exam: L/L: OD: WNL OS: WNL  C/S: OD: white and quiet OS: 1+ injection  K: OD: clear, no abnormal staining OS: wire penetrating - single, thin guage  A/C: OD: grossly deep and quiet OS: shallow, fibrin in AC  I: OD: round and regular OS: mild peaking  L: OD: NSC OS: wire penetrating w/ violation of lens capsule  DFE: deferred OD, no view OS  A/P:  1. Intraocular Foreign Body OS - Wire - Exam w/ curved wire violating cornea and lens capsule w/ evidence of violation of posterior capsule and in anterior vitreous on CT scan - Recommend evaluation at [facility] for removal of IOFB - likely will need PPV/PPL to safely remove IOFB - Pt received Tetanus in ED today  Bellany Elbaum T. Sherryll Burger, MD [facility] [phone]

## 2023-06-13 NOTE — ED Provider Notes (Addendum)
Pt endorses foreign body to the eye.  Pt thinks it was a piece of wire.  Initially seen by PA Truddie Hidden. Physical Exam  BP (!) 143/93 (BP Location: Left Arm)   Pulse 71   Temp 97.6 F (36.4 C) (Oral)   Resp 18   SpO2 100%   Physical Exam Eyes:     General:        Left eye: Foreign body present.    Conjunctiva/sclera:     Left eye: Left conjunctiva is injected.     Pupils:     Left eye: Pupil is not round.     Comments: Pupil irregular, wire foreign body noted penetrating through the cornea into the anterior chamber    Pt examined by me once slit lamp located  Procedures  Procedures  ED Course / MDM   Clinical Course as of 06/13/23 2151  Wed Jun 13, 2023  1914 Case discussed with Dr. Sherryll Burger ophthalmology.  Recommends CT scan without contrast.  Findings will help determine next operative steps.  We will contact him back after the scan is performed [JK]  2150 Case discussed with Dr Gaynelle Arabian.  Accepts transfer to [facility].  We will share images [JK]    Clinical Course User Index [JK] [name], MD   Medical Decision Making Amount and/or Complexity of Data Reviewed Radiology: ordered.  Risk Prescription drug management. Decision regarding hospitalization.   Patient presented to ED with complaints of possible foreign body in the eye.  Exam is more complex than a simple corneal foreign body.  Patient has a penetrating eye injury from a piece of wire.  Have consulted with Dr. Clelia Croft ophthalmology.  We will obtain the CT scan.  Will contact him once the CT scan results are back to review next steps   Case reviewed with Dr Sherryll Burger.  After examining pt and reviewing scans, Dr Sherryll Burger feels pt needs transfer to [facility].  Appropriate equipment not available here.    [name], MD 06/13/23 Prentice Docker    [name], MD 06/13/23 Delaine Lame    [name], MD 06/13/23 2137

## 2023-06-13 NOTE — ED Triage Notes (Signed)
Pt c/o possible piece of metal or plastic in L eye that happened approx 1 hour ago; conjunctiva red, watery; endorses blurred vision

## 2023-06-13 NOTE — ED Provider Notes (Signed)
[place] EMERGENCY DEPARTMENT AT [facility] Provider Note   CSN: [id number] Arrival date & time: 06/13/23  1433     History  Chief Complaint  Patient presents with   Foreign Body in Eye    Christian Aguirre is a 62 y.o. male who presents with left eye pain.  States he was grinding something at work about 2 hours ago which contained both plastic and metal, and then felt like something got into his eye.  Denies any changes in vision.  Denies any contact use.  Last tetanus unknown.   HPI     Home Medications Prior to Admission medications   Medication Sig Start Date End Date Taking? Authorizing Provider  aspirin EC 81 MG tablet Take 1 tablet (81 mg total) by mouth daily. Swallow whole. 10/10/22  Yes Jodelle Red, MD  ALPRAZolam Prudy Feeler) 0.5 MG tablet Take 0.5 tablets (0.25 mg total) by mouth at bedtime as needed for anxiety. Patient not taking: Reported on 06/13/2023 12/17/17   Revankar, Aundra Dubin, MD  amLODipine (NORVASC) 10 MG tablet Take 10 mg by mouth daily.    [provider]  lisinopril (PRINIVIL,ZESTRIL) 20 MG tablet Take 20 mg by mouth daily.    [provider]  metFORMIN (GLUCOPHAGE) 1000 MG tablet Take 1,000 mg by mouth daily with breakfast.    [provider]  nitroGLYCERIN (NITROSTAT) 0.4 MG SL tablet Place 1 tablet (0.4 mg total) under the tongue every 5 (five) minutes as needed. 12/17/17 09/14/22  Revankar, Aundra Dubin, MD  simvastatin (ZOCOR) 10 MG tablet Take 10 mg by mouth at bedtime.    [provider]      Allergies    Patient has no known allergies.    Review of Systems   Review of Systems  Eyes:  Positive for pain.    Physical Exam Updated Vital Signs BP (!) 168/103   Pulse 60   Temp 97.8 F (36.6 C)   Resp 18   SpO2 95%  Physical Exam Vitals and nursing note reviewed.  Constitutional:      Appearance: Normal appearance.  HENT:     Head: Atraumatic.  Eyes:     General: Lids are normal.         Left eye: Foreign body present.    Extraocular Movements: Extraocular movements intact.     Conjunctiva/sclera:     Left eye: Left conjunctiva is injected.     Comments: Fluorescein exam reveals no corneal abrasions, small foreign body noted in the left cornea, seems to be imbedded.   Pulmonary:     Effort: Pulmonary effort is normal.  Neurological:     General: No focal deficit present.     Mental Status: He is alert.  Psychiatric:        Mood and Affect: Mood normal.        Behavior: Behavior normal.     ED Results / Procedures / Treatments   Labs (all labs ordered are listed, but only abnormal results are displayed) Labs Reviewed - No data to display  EKG None  Radiology CT Orbits Wo Contrast  Result Date: 06/13/2023 CLINICAL DATA:  Penetrating left eye injury EXAM: CT ORBITS WITHOUT CONTRAST TECHNIQUE: Multidetector CT imaging of the orbits was performed using the standard protocol without intravenous contrast. Multiplanar CT image reconstructions were also generated. RADIATION DOSE REDUCTION: This exam was performed according to the departmental dose-optimization program which includes automated exposure control, adjustment of the mA and/or kV according to patient  size and/or use of iterative reconstruction technique. COMPARISON:  CT Neck 08/20/18 FINDINGS: Orbits: Right globe is normal in appearance. 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 No orbital mass. Normal appearance of the optic nerve-sheath complexes, extraocular muscles, orbital fat and lacrimal glands. Visible paranasal sinuses: No middle ear or mastoid effusion. Mild pansinus mucosal thickening. There is a chronic fracture of the lamina papyracea on the right. Soft tissues: Normal. Osseous: No fracture or aggressive lesion. Limited intracranial: No acute or significant finding. 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 Electronically Signed   By: Lorenza Cambridge M.D.   On: 06/13/2023 19:49    Procedures Procedures    Medications Ordered in ED Medications  fluorescein ophthalmic strip 1 strip (1 strip Left Eye Given 06/13/23 1640)  tetracaine (PONTOCAINE) 0.5 % ophthalmic solution 2 drop (2 drops Left Eye Given 06/13/23 1640)  ibuprofen (ADVIL) tablet 600 mg (600 mg Oral Given 06/13/23 1719)  Tdap (BOOSTRIX) injection 0.5 mL (0.5 mLs Intramuscular Given 06/13/23 1720)  oxyCODONE-acetaminophen (PERCOCET/ROXICET) 5-325 MG per tablet 1 tablet (1 tablet Oral Given 06/13/23 1854)  morphine (PF) 4 MG/ML injection 4 mg (4 mg Intravenous Given 06/13/23 2041)  ondansetron (ZOFRAN) injection 4 mg (4 mg Intravenous Given 06/13/23 2049)  HYDROmorphone (DILAUDID) injection 1 mg (1 mg Intravenous Given 06/13/23 2144)    ED Course/ Medical Decision Making/ A&P Clinical Course as of 06/13/23 2355  Wed Jun 13, 2023  1914 Case discussed with Dr. Sherryll Burger ophthalmology.  Recommends CT scan without contrast.  Findings will help determine next operative steps.  We will contact him back after the scan is performed [JK]  2150 Case discussed with Dr Gaynelle Arabian.  Accepts transfer to [facility].  We will share images [JK]    Clinical Course User Index [JK] [name], MD                                 Medical Decision Making Amount and/or Complexity of Data Reviewed Radiology: ordered.  Risk Prescription drug management.   62 y.o. male presents to the ED for concern of foreign body in left eye  Differential diagnosis includes but is not limited to left eye foreign body, corneal abrasion, ruptured globe   ED Course:  Patient presents to the ED for concern of a foreign body and pain of his left eye.  States he was grinding something at work earlier without his glasses on something on his eye.  Denies any changes in vision.  On  fluorescein exam, no dye uptake indicative of abrasions. No signs of ruptured globe. However, a speck of a foreign body was noted in the cornea. Tonometry deferred as to not push object further into eye. It did look to be embedded but unknown how deep. Dr. Lynelle Doctor evaluated patient with slit lamp and found that there is a foreign body in the left eye that extended into the cornea. Dr. Sherryll Burger with ophthalmology was consulted and he recommended a CT of the orbit for further evaluation.  This revealed that the foreign body was going into the posterior capsule and anterior vitreous, Dr. Sherryll Burger recommended patient be transferred to [facility] for removal of the foreign body.  Patient was transferred via transport to [facility] for further management Tetanus was updated here  in the ER patient was given ibuprofen and Percocet for pain   Impression: Left eye intraocular foreign body  Disposition:  Transferred to [facility]   Imaging Studies ordered: I ordered imaging studies including CT orbit I independently visualized the imaging with scope of interpretation limited to determining acute life threatening conditions related to emergency care. Imaging showed left eye intraocular foreign body I agree with the radiologist interpretation   Consultations Obtained: I requested consultation with the ophthalmologist Dr. Glynis Smiles,  and discussed lab and imaging findings as well as pertinent plan - they come evaluate the patient and provide further recommendations             Final Clinical Impression(s) / ED Diagnoses Final diagnoses:  Penetrating eye injury of left eye, initial encounter    Rx / DC Orders ED Discharge Orders     None         Arabella Merles, Cordelia Poche 06/13/23 2356    [name], MD 06/14/23 1401

## 2023-09-06 ENCOUNTER — Other Ambulatory Visit: Payer: Self-pay

## 2023-09-06 ENCOUNTER — Emergency Department (HOSPITAL_BASED_OUTPATIENT_CLINIC_OR_DEPARTMENT_OTHER): Payer: BC Managed Care – PPO | Admitting: Radiology

## 2023-09-06 ENCOUNTER — Emergency Department (HOSPITAL_BASED_OUTPATIENT_CLINIC_OR_DEPARTMENT_OTHER)
Admission: EM | Admit: 2023-09-06 | Discharge: 2023-09-06 | Disposition: A | Discharge status: Home / Self Care | Payer: BC Managed Care – PPO | Attending: Emergency Medicine | Admitting: Emergency Medicine

## 2023-09-06 ENCOUNTER — Telehealth: Payer: Self-pay | Admitting: Cardiology

## 2023-09-06 ENCOUNTER — Encounter: Payer: Self-pay | Admitting: *Deleted

## 2023-09-06 ENCOUNTER — Encounter (HOSPITAL_BASED_OUTPATIENT_CLINIC_OR_DEPARTMENT_OTHER): Payer: Self-pay | Admitting: Emergency Medicine

## 2023-09-06 ENCOUNTER — Ambulatory Visit: Payer: BC Managed Care – PPO | Attending: Cardiology

## 2023-09-06 DIAGNOSIS — Z87891 Personal history of nicotine dependence: Secondary | ICD-10-CM | POA: Insufficient documentation

## 2023-09-06 DIAGNOSIS — R002 Palpitations: Secondary | ICD-10-CM | POA: Insufficient documentation

## 2023-09-06 DIAGNOSIS — Z7982 Long term (current) use of aspirin: Secondary | ICD-10-CM | POA: Insufficient documentation

## 2023-09-06 DIAGNOSIS — Z7984 Long term (current) use of oral hypoglycemic drugs: Secondary | ICD-10-CM | POA: Diagnosis not present

## 2023-09-06 DIAGNOSIS — I10 Essential (primary) hypertension: Secondary | ICD-10-CM | POA: Insufficient documentation

## 2023-09-06 DIAGNOSIS — Z79899 Other long term (current) drug therapy: Secondary | ICD-10-CM | POA: Diagnosis not present

## 2023-09-06 DIAGNOSIS — I493 Ventricular premature depolarization: Secondary | ICD-10-CM

## 2023-09-06 DIAGNOSIS — R079 Chest pain, unspecified: Secondary | ICD-10-CM | POA: Insufficient documentation

## 2023-09-06 DIAGNOSIS — E119 Type 2 diabetes mellitus without complications: Secondary | ICD-10-CM | POA: Insufficient documentation

## 2023-09-06 DIAGNOSIS — R61 Generalized hyperhidrosis: Secondary | ICD-10-CM | POA: Insufficient documentation

## 2023-09-06 LAB — CBC
HCT: 40.7 % (ref 39.0–52.0)
Hemoglobin: 13.9 g/dL (ref 13.0–17.0)
MCH: 30.3 pg (ref 26.0–34.0)
MCHC: 34.2 g/dL (ref 30.0–36.0)
MCV: 88.7 fL (ref 80.0–100.0)
Platelets: 240 10*3/uL (ref 150–400)
RBC: 4.59 MIL/uL (ref 4.22–5.81)
RDW: 12.4 % (ref 11.5–15.5)
WBC: 4.7 10*3/uL (ref 4.0–10.5)
nRBC: 0 % (ref 0.0–0.2)

## 2023-09-06 LAB — TROPONIN I (HIGH SENSITIVITY)
Troponin I (High Sensitivity): 3 ng/L (ref <18)
Troponin I (High Sensitivity): 4 ng/L (ref <18)

## 2023-09-06 LAB — BASIC METABOLIC PANEL
Anion gap: 10 (ref 5–15)
BUN: 14 mg/dL (ref 8–23)
CO2: 27 mmol/L (ref 22–32)
Calcium: 9.9 mg/dL (ref 8.9–10.3)
Chloride: 104 mmol/L (ref 98–111)
Creatinine, Ser: 0.99 mg/dL (ref 0.61–1.24)
GFR, Estimated: >60 mL/min (ref >60)
Glucose, Bld: 113 mg/dL — ABNORMAL HIGH (ref 70–99)
Potassium: 3.8 mmol/L (ref 3.5–5.1)
Sodium: 141 mmol/L (ref 135–145)

## 2023-09-06 LAB — D-DIMER, QUANTITATIVE: D-Dimer, Quant: <0.27 ug{FEU}/mL (ref 0.00–0.50)

## 2023-09-06 MED ORDER — PROPRANOLOL HCL 20 MG PO TABS: 20.0000 mg | ORAL_TABLET | Freq: Two times a day (BID) | ORAL | 0 refills | Status: DC

## 2023-09-06 NOTE — ED Provider Notes (Signed)
[place] EMERGENCY DEPARTMENT AT [facility] Provider Note   CSN: [id number] Arrival date & time: 09/06/23  0753     History  Chief Complaint  Patient presents with   Chest Pain    Christian Aguirre is a 62 y.o. male.   Chest Pain 62 year old male history of hypertension, diabetes, hyperlipidemia presenting for chest pain.  Patient is a former smoker.  Patient states for over a year has had episodes of chest pain associate with palpitations, sharp left-sided chest pain rating down his left arm, occasional sweating.  Nonexertional, seems random to him.  He does get somewhat anxious with this but is tried anxiety medicine that has not done anything helpful.  He had a Zio patch done last year with 1 run of supraventricular tachycardia lasting 4 beats.  He has had a coronary CTA that showed calcium score 7 which was 59th percentile.  Stress test appears to be 2019 which was a stress echo which was reassuring.  Occasional nausea, no vomiting.  No history of PE or pleuritic pain.     Home Medications Prior to Admission medications   Medication Sig Start Date End Date Taking? Authorizing Provider  propranolol (INDERAL) 20 MG tablet Take 1 tablet (20 mg total) by mouth 2 (two) times daily. 09/06/23  Yes Laurence Spates, MD  ALPRAZolam Prudy Feeler) 0.5 MG tablet Take 0.5 tablets (0.25 mg total) by mouth at bedtime as needed for anxiety. Patient not taking: Reported on 06/13/2023 12/17/17   Revankar, Aundra Dubin, MD  amLODipine (NORVASC) 10 MG tablet Take 10 mg by mouth daily.    [provider]  aspirin EC 81 MG tablet Take 1 tablet (81 mg total) by mouth daily. Swallow whole. 10/10/22   Jodelle Red, MD  atorvastatin (LIPITOR) 20 MG tablet Take 20 mg by mouth daily.    [provider]  CONTOUR TEST test strip 1 each by Other route 2 (two) times daily.    [provider]  hydrOXYzine (ATARAX) 50 MG tablet Take 50 mg by mouth 3 (three) times daily as  needed.    [provider]  lisinopril (PRINIVIL,ZESTRIL) 20 MG tablet Take 20 mg by mouth daily.    [provider]  metFORMIN (GLUCOPHAGE) 1000 MG tablet Take 1,000 mg by mouth daily with breakfast.    [provider]  nitroGLYCERIN (NITROSTAT) 0.4 MG SL tablet Place 1 tablet (0.4 mg total) under the tongue every 5 (five) minutes as needed. 12/17/17 09/14/22  Revankar, Aundra Dubin, MD  prednisoLONE acetate (PRED FORTE) 1 % ophthalmic suspension Place 1 drop into the left eye 4 (four) times daily.    [provider]  simvastatin (ZOCOR) 10 MG tablet Take 10 mg by mouth at bedtime.    [provider]      Allergies    Patient has no known allergies.    Review of Systems   Review of Systems  Cardiovascular:  Positive for chest pain.  Review of systems completed and notable as per HPI.  ROS otherwise negative.   Physical Exam Updated Vital Signs BP (!) 138/96   Pulse 68   Temp 99.1 F (37.3 C) (Oral)   Resp 16   Wt 100.8 kg   SpO2 100%   BMI 30.14 kg/m  Physical Exam Vitals and nursing note reviewed.  Constitutional:      General: He is not in acute distress.    Appearance: He is well-developed.  HENT:     Head: Normocephalic and  atraumatic.     Nose: Nose normal.     Mouth/Throat:     Mouth: Mucous membranes are moist.     Pharynx: Oropharynx is clear.  Eyes:     Extraocular Movements: Extraocular movements intact.     Conjunctiva/sclera: Conjunctivae normal.     Pupils: Pupils are equal, round, and reactive to light.  Cardiovascular:     Rate and Rhythm: Normal rate and regular rhythm.     Pulses: Normal pulses.     Heart sounds: Normal heart sounds. No murmur heard. Pulmonary:     Effort: Pulmonary effort is normal. No respiratory distress.     Breath sounds: Normal breath sounds.  Abdominal:     Palpations: Abdomen is soft.     Tenderness: There is no abdominal tenderness.  Musculoskeletal:        General: No swelling.      Cervical back: Neck supple.     Right lower leg: No edema.     Left lower leg: No edema.  Skin:    General: Skin is warm and dry.     Capillary Refill: Capillary refill takes less than 2 seconds.  Neurological:     Mental Status: He is alert.  Psychiatric:        Mood and Affect: Mood normal.     ED Results / Procedures / Treatments   Labs (all labs ordered are listed, but only abnormal results are displayed) Labs Reviewed  BASIC METABOLIC PANEL - Abnormal; Notable for the following components:      Result Value   Glucose, Bld 113 (*)    All other components within normal limits  CBC  D-DIMER, QUANTITATIVE (NOT AT [facility])  TROPONIN I (HIGH SENSITIVITY)  TROPONIN I (HIGH SENSITIVITY)    EKG EKG Interpretation Date/Time:  Thursday September 06 2023 08:01:03 EST Ventricular Rate:  65 PR Interval:  172 QRS Duration:  84 QT Interval:  420 QTC Calculation: 436 R Axis:   32  Text Interpretation: Normal sinus rhythm Normal ECG When compared with ECG of 01-May-2023 14:45, No significant change was found Confirmed by Fulton Reek [phone]) on 09/06/2023 8:05:32 AM  Radiology DG Chest 2 View  Result Date: 09/06/2023 CLINICAL DATA:  Chest pain. EXAM: CHEST - 2 VIEW COMPARISON:  05/01/2023. FINDINGS: Bilateral lung fields are clear. Bilateral costophrenic angles are clear. Normal cardio-mediastinal silhouette. No acute osseous abnormalities. The soft tissues are within normal limits. IMPRESSION: *No active cardiopulmonary disease. Electronically Signed   By: Jules Schick M.D.   On: 09/06/2023 08:57    Procedures Procedures    Medications Ordered in ED Medications - No data to display  ED Course/ Medical Decision Making/ A&P Clinical Course as of 09/06/23 2005  Thu Sep 06, 2023  1139 Branch: propanolol 20 mg BID PRN, heart monitor, 3 day zio, OP follow up [JD]    Clinical Course User Index [JD] Laurence Spates, MD                                 Medical Decision  Making Amount and/or Complexity of Data Reviewed Labs: ordered. Radiology: ordered.  Risk Prescription drug management.   Medical Decision Making:   Christian Aguirre is a 62 y.o. male who presented to the ED today with episodic chest pain and palpitations per vital signs reviewed.  Exam is overall well-appearing.  Currently is asymptomatic.  Reports a year of symptoms for which has  been seen multiple times and been evaluate by cardiology.  He does have respecters for ACS although had low suspicion by cardiology for ischemic cause.  He had a heart monitor that showed 1 episode of SVT but no other arrhythmia.  He states that the symptoms have worsened over the last few weeks since his recent eye surgery.  Obtain D-dimer evaluate for possible PE.  While was in the room he had an episode on the monitor that she had a brief episode of tachycardia rate approximately 150, appears like it could be an episode of SVT versus may be nonsustained V. tach.  He was symptomatic with this but it quickly resolved.  Pain resolved with this as well.  Will plan to discuss with cardiology after workup for possible ACS.  EKG initially was reassuring.   Patient placed on continuous vitals and telemetry monitoring while in ED which was reviewed periodically.  Reviewed and confirmed nursing documentation for past medical history, family history, social history.  Reassessment and Plan:   On reassessment remains stable and asymptomatic.  Reviewed telemetry no other episodes.  Quite reassuring.  D-dimer is negative low sufficient for PE.  Troponins negative x 2 with reassuring EKG low suspicion for ACS.  Pain seems inconsistent with dissection.  I talked with Dr. Wyline Mood with cardiology who reviewed his rhythm strip.  She does not think it is ventricular and wonder if his it may just be ectopy or PVCs.  Recommends trialing propranolol 20 mg twice daily and following up with cardiology.  Also recommend heart monitor if we are  able to but unfortunate were not able to order replaced a heart monitor here.  I discussed with the patient, he is comfortable this plan.  Recommend he start the propranolol, discussed that if he gets low heart rate, fainting or any other concerning symptoms with this he should stop taking it.  Strict return precautions given.  Discharged in stable condition.  Recommend PCP follow-up as well.   Patient's presentation is most consistent with acute complicated illness / injury requiring diagnostic workup.           Final Clinical Impression(s) / ED Diagnoses Final diagnoses:  Chest pain, unspecified type    Rx / DC Orders ED Discharge Orders          Ordered    propranolol (INDERAL) 20 MG tablet  2 times daily        09/06/23 1208    Ambulatory referral to Cardiology       Comments: If you have not heard from the Cardiology office within the next 72 hours please call [phone].   09/06/23 1208              Laurence Spates, MD 09/06/23 2005

## 2023-09-06 NOTE — Discharge Instructions (Addendum)
Your lab work today was reassuring.  You did have an episode of an abnormally fast heartbeat.  Cardiology should call you to schedule follow-up, you should also follow-up with your doctor.  You are being put on a new medicine to help with this abnormal heartbeat.  If it makes you extremely fatigued or causes dizziness or fainting you should stop taking it.  If you develop worsening chest pain, difficulty breathing or any other new concerning symptoms you should return to the ED.

## 2023-09-06 NOTE — Progress Notes (Unsigned)
Enrolled for [employer] to mail a ZIO XT long term holter monitor to the patients address on file.   Dr. Tanja Port to read.

## 2023-09-06 NOTE — ED Triage Notes (Signed)
Pt c/o generalized CP "for awhile". Reports diaphoresis overnight with tachycardia and LT arm radiation. Recent eye surgery x ~ 1 month

## 2023-09-06 NOTE — Telephone Encounter (Signed)
Hi, this patient was seen at the ED for chest pain. Dr. Wyline Mood has recommended 3 day zio and follow up. I scheduled apointment already, but can you please call patient and make sure that appointment time is okay? Thanks.   Shelly, 3 day zio per Branch please. Dr. Cristal Deer to read.

## 2023-09-06 NOTE — Telephone Encounter (Signed)
Left the pt a message to call the office back to confirm post-ED follow-up appt with our office and to go over ordered 3 day zio information.

## 2023-09-06 NOTE — ED Notes (Signed)
Dc instructions reviewed with patient. Patient voiced understanding. Dc with belongings.  °

## 2023-09-13 ENCOUNTER — Emergency Department (HOSPITAL_BASED_OUTPATIENT_CLINIC_OR_DEPARTMENT_OTHER)
Admission: EM | Admit: 2023-09-13 | Discharge: 2023-09-13 | Disposition: A | Discharge status: Home / Self Care | Payer: BC Managed Care – PPO | Attending: Emergency Medicine | Admitting: Emergency Medicine

## 2023-09-13 ENCOUNTER — Emergency Department (HOSPITAL_BASED_OUTPATIENT_CLINIC_OR_DEPARTMENT_OTHER): Payer: BC Managed Care – PPO | Admitting: Radiology

## 2023-09-13 ENCOUNTER — Other Ambulatory Visit: Payer: Self-pay

## 2023-09-13 ENCOUNTER — Encounter (HOSPITAL_BASED_OUTPATIENT_CLINIC_OR_DEPARTMENT_OTHER): Payer: Self-pay | Admitting: Emergency Medicine

## 2023-09-13 DIAGNOSIS — R0789 Other chest pain: Secondary | ICD-10-CM | POA: Diagnosis present

## 2023-09-13 DIAGNOSIS — Z7982 Long term (current) use of aspirin: Secondary | ICD-10-CM | POA: Insufficient documentation

## 2023-09-13 DIAGNOSIS — R079 Chest pain, unspecified: Secondary | ICD-10-CM

## 2023-09-13 LAB — CBC
HCT: 41.4 % (ref 39.0–52.0)
Hemoglobin: 13.9 g/dL (ref 13.0–17.0)
MCH: 29.6 pg (ref 26.0–34.0)
MCHC: 33.6 g/dL (ref 30.0–36.0)
MCV: 88.3 fL (ref 80.0–100.0)
Platelets: 256 10*3/uL (ref 150–400)
RBC: 4.69 MIL/uL (ref 4.22–5.81)
RDW: 12 % (ref 11.5–15.5)
WBC: 5.2 10*3/uL (ref 4.0–10.5)
nRBC: 0 % (ref 0.0–0.2)

## 2023-09-13 LAB — BASIC METABOLIC PANEL
Anion gap: 8 (ref 5–15)
BUN: 15 mg/dL (ref 8–23)
CO2: 31 mmol/L (ref 22–32)
Calcium: 9.5 mg/dL (ref 8.9–10.3)
Chloride: 102 mmol/L (ref 98–111)
Creatinine, Ser: 0.99 mg/dL (ref 0.61–1.24)
GFR, Estimated: >60 mL/min (ref >60)
Glucose, Bld: 111 mg/dL — ABNORMAL HIGH (ref 70–99)
Potassium: 3.6 mmol/L (ref 3.5–5.1)
Sodium: 141 mmol/L (ref 135–145)

## 2023-09-13 LAB — TROPONIN I (HIGH SENSITIVITY)
Troponin I (High Sensitivity): 5 ng/L (ref <18)
Troponin I (High Sensitivity): 4 ng/L (ref <18)

## 2023-09-13 NOTE — Discharge Instructions (Signed)
You have been seen and discharged from the emergency department.  Your heart and lung workup was normal today.  Please follow-up with your scheduled cardiology appointment tomorrow.  Follow-up with your primary provider for further evaluation and further care. Take home medications as prescribed. If you have any worsening symptoms or further concerns for your health please return to an emergency department for further evaluation.

## 2023-09-13 NOTE — ED Provider Notes (Signed)
[place] EMERGENCY DEPARTMENT AT [facility] Provider Note   CSN: [id number] Arrival date & time: 09/13/23  1324     History  Chief Complaint  Patient presents with   Chest Pain    Christian Aguirre is a 62 y.o. male.  HPI   63 year old male presents emergency department left-sided chest pain.  Patient has been having these episodes of left-sided chest pain for about 2 years now.  He currently has an appointment tomorrow with cardiology for further evaluation.  Patient states that he gets these brief sharp-like episodes of pain that originates in the left chest and goes to the left arm and sometimes the left jaw.  Last time he was seen for the chest pain he was placed on propranolol for mention of racing heartbeat.  He states that the racing heartbeat has improved but the pain episodes continue to come.  Currently he denies any active pain.  No shortness of breath.  No swelling of his lower extremities.  No other acute change in his medical history.  Home Medications Prior to Admission medications   Medication Sig Start Date End Date Taking? Authorizing Provider  ALPRAZolam Prudy Feeler) 0.5 MG tablet Take 0.5 tablets (0.25 mg total) by mouth at bedtime as needed for anxiety. Patient not taking: Reported on 06/13/2023 12/17/17   Revankar, Aundra Dubin, MD  amLODipine (NORVASC) 10 MG tablet Take 10 mg by mouth daily.    [provider]  aspirin EC 81 MG tablet Take 1 tablet (81 mg total) by mouth daily. Swallow whole. 10/10/22   Jodelle Red, MD  atorvastatin (LIPITOR) 20 MG tablet Take 20 mg by mouth daily.    [provider]  CONTOUR TEST test strip 1 each by Other route 2 (two) times daily.    [provider]  hydrOXYzine (ATARAX) 50 MG tablet Take 50 mg by mouth 3 (three) times daily as needed.    [provider]  lisinopril (PRINIVIL,ZESTRIL) 20 MG tablet Take 20 mg by mouth daily.    [provider]  metFORMIN (GLUCOPHAGE) 1000  MG tablet Take 1,000 mg by mouth daily with breakfast.    [provider]  nitroGLYCERIN (NITROSTAT) 0.4 MG SL tablet Place 1 tablet (0.4 mg total) under the tongue every 5 (five) minutes as needed. 12/17/17 09/14/22  Revankar, Aundra Dubin, MD  prednisoLONE acetate (PRED FORTE) 1 % ophthalmic suspension Place 1 drop into the left eye 4 (four) times daily.    [provider]  propranolol (INDERAL) 20 MG tablet Take 1 tablet (20 mg total) by mouth 2 (two) times daily. 09/06/23   Laurence Spates, MD  simvastatin (ZOCOR) 10 MG tablet Take 10 mg by mouth at bedtime.    [provider]      Allergies    Patient has no known allergies.    Review of Systems   Review of Systems  Constitutional:  Negative for fever.  Respiratory:  Negative for chest tightness and shortness of breath.   Cardiovascular:  Positive for chest pain and palpitations. Negative for leg swelling.  Gastrointestinal:  Negative for abdominal pain, diarrhea and vomiting.  Genitourinary:  Negative for flank pain.  Musculoskeletal:  Negative for back pain.  Skin:  Negative for rash.  Neurological:  Negative for headaches.    Physical Exam Updated Vital Signs BP (!) 155/97   Pulse 65   Temp 98.4 F (36.9 C) (Oral)   Resp 12   Ht 6' (1.829 m)   Wt 98.9  kg   SpO2 (!) 14%   BMI 29.57 kg/m  Physical Exam Vitals and nursing note reviewed.  Constitutional:      General: He is not in acute distress.    Appearance: Normal appearance.  HENT:     Head: Normocephalic.     Mouth/Throat:     Mouth: Mucous membranes are moist.  Cardiovascular:     Rate and Rhythm: Normal rate.  Pulmonary:     Effort: Pulmonary effort is normal. No tachypnea or respiratory distress.  Chest:     Chest wall: No tenderness or crepitus.  Abdominal:     Palpations: Abdomen is soft.     Tenderness: There is no abdominal tenderness.  Musculoskeletal:     Right lower leg: No edema.     Left lower leg: No edema.  Skin:     General: Skin is warm.  Neurological:     Mental Status: He is alert and oriented to person, place, and time. Mental status is at baseline.  Psychiatric:        Mood and Affect: Mood normal.     ED Results / Procedures / Treatments   Labs (all labs ordered are listed, but only abnormal results are displayed) Labs Reviewed  CBC  BASIC METABOLIC PANEL  TROPONIN I (HIGH SENSITIVITY)    EKG None  Radiology No results found.  Procedures Procedures    Medications Ordered in ED Medications - No data to display  ED Course/ Medical Decision Making/ A&P                                 Medical Decision Making Amount and/or Complexity of Data Reviewed Labs: ordered. Radiology: ordered.   62 year old male presents emergency department after an episode of chest pain.  This has been ongoing for couple years, he currently has an appointment with cardiology to be seen yesterday.  States that he keeps having brief episodes of left-sided chest pain that radiates to his arm and jaw.  He is been evaluated before with normal cardiac workup but presents today with concern for ongoing symptoms.  Vitals are normal and stable on arrival.  EKG shows no concerning findings.  Workup is reassuring with 2 negative troponins without a delta.  Patient's been worked up and ruled out for PE/dissection before, as recently as last week with a negative dimer.  Do not feel the patient warrants further emergent dimer/CT imaging.  I have low suspicion for both of these things.  Patient has follow-up with cardiology tomorrow.  Unclear the etiology of his chest pain however does not seem to be related to ACS at this time.  Will defer to cardiology for outpatient workup.  Patient at this time appears safe and stable for discharge and close outpatient follow up. Discharge plan and strict return to ED precautions discussed, patient verbalizes understanding and agreement.        Final Clinical Impression(s) /  ED Diagnoses Final diagnoses:  None    Rx / DC Orders ED Discharge Orders     None         Rozelle Logan, DO 09/13/23 1452

## 2023-09-13 NOTE — ED Triage Notes (Signed)
Pt caox4, ambulatory NAD c/o CP and L arm pain intermittent for approx 2 wks. Pt also c/o dizziness, lightheadedness, and nausea. Pt reports he was seen in ED 11/7 for same.

## 2023-09-14 ENCOUNTER — Ambulatory Visit (HOSPITAL_BASED_OUTPATIENT_CLINIC_OR_DEPARTMENT_OTHER): Payer: BC Managed Care – PPO | Admitting: Cardiology

## 2023-09-14 ENCOUNTER — Encounter (HOSPITAL_BASED_OUTPATIENT_CLINIC_OR_DEPARTMENT_OTHER): Payer: Self-pay | Admitting: Cardiology

## 2023-09-14 VITALS — BP 126/82 | HR 73 | Ht 72.0 in | Wt 216.2 lb

## 2023-09-14 DIAGNOSIS — Z7189 Other specified counseling: Secondary | ICD-10-CM

## 2023-09-14 DIAGNOSIS — I2510 Atherosclerotic heart disease of native coronary artery without angina pectoris: Secondary | ICD-10-CM | POA: Diagnosis not present

## 2023-09-14 DIAGNOSIS — R002 Palpitations: Secondary | ICD-10-CM | POA: Diagnosis not present

## 2023-09-14 DIAGNOSIS — I10 Essential (primary) hypertension: Secondary | ICD-10-CM | POA: Diagnosis not present

## 2023-09-14 DIAGNOSIS — R0789 Other chest pain: Secondary | ICD-10-CM | POA: Diagnosis not present

## 2023-09-14 NOTE — Progress Notes (Signed)
Cardiology Office Note:  .   Date:  09/14/2023  ID:  Christian Aguirre, DOB 11-16-1960, MRN [id number] PCP: Aviva Kluver  [facility] Providers Cardiologist:  Jodelle Red, MD {  History of Present Illness: .   Christian Aguirre is a 62 y.o. male with a hx of hypertension, type 2 diabetes, and atypical chest pain, who is seen for follow-up today. He was initially seen 09/14/2022 as a new consult for the evaluation and management of chest pain and palpitations.  Prior cardiac history: When he was younger, about 62 yo, he had rheumatic fever. No family history of heart disease. Former smoker.    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 Also noted were mild aortic valve calcifications (AV calcium score 120). His monitor 09/2022 showed predominantly sinus rhythm with 1 run of supraventricular tachycardia lasting 4 beats. Very rare early beats (<1%). No VT, Afib, high degree block, or pauses.   Today: Was ordered a monitor in the ER but he hasn't received it yet. He reports left lateral rib and left clavicular pain, was seen in the ER for this 09/06/23. This has been intermittent over the last year, returned in July and has been seen in the ER several times since. Pain is sharp, nonradiating, no other associated symptoms. Has not tried antiinflammatories or pain medication. Not related to certain times of day or exertion. Is related to movement.  ROS: Denies chest pain, shortness of breath at rest or with normal exertion. No PND, orthopnea, LE edema or unexpected weight gain. No syncope or palpitations. ROS otherwise negative except as noted.   Studies Reviewed: Marland Kitchen    EKG:       Physical Exam:   VS:  BP 126/82   Pulse 73   Ht 6' (1.829 m)   Wt 216 lb 3.2 oz (98.1 kg)   SpO2 98%   BMI 29.32 kg/m    Wt Readings from Last 3 Encounters:  09/14/23 216 lb 3.2 oz  (98.1 kg)  09/13/23 218 lb (98.9 kg)  09/06/23 222 lb 3.6 oz (100.8 kg)    GEN: Well nourished, well developed in no acute distress HEENT: Normal, moist mucous membranes NECK: No JVD CARDIAC: regular rhythm, normal S1 and S2, no rubs or gallops. No murmur. Palpable focal muscle knots in L midaxillary line, L clavicle, and L posterior neck/upper shoulder that reproduce his pain VASCULAR: Radial and DP pulses 2+ bilaterally. No carotid bruits RESPIRATORY:  Clear to auscultation without rales, wheezing or rhonchi  ABDOMEN: Soft, non-tender, non-distended MUSCULOSKELETAL:  Ambulates independently SKIN: Warm and dry, no edema NEUROLOGIC:  Alert and oriented x 3. No focal neuro deficits noted. PSYCHIATRIC:  Normal affect    ASSESSMENT AND PLAN: .    Chest pain, atypical -palpable muscle knots as noted that reproduce his pain. Spent extensive time today discussing noncardiac vs cardiac chest pain and management suggestions -CT shows nonobstructive CAD--not the etiology of his symptoms, but continue aspirin and statin -reviewed red flag warning signs that need immediate medical attention   Palpitations -reviewed prior Zio, overall very reassuring, he is sensitive to single PVCs but these are rare -he has monitor ordered but states he has not received. Will reach out to team to follow up on this   Hypertension -continue lisinopril  CV risk counseling and prevention -recommend heart healthy/Mediterranean diet, with whole grains, fruits, vegetable, fish, lean  meats, nuts, and olive oil. Limit salt. -recommend moderate walking, 3-5 times/week for 30-50 minutes each session. Aim for at least 150 minutes.week. Goal should be pace of 3 miles/hours, or walking 1.5 miles in 30 minutes -recommend avoidance of tobacco products. Avoid excess alcohol.  Dispo: as needed  Signed, Jodelle Red, MD   Jodelle Red, MD, PhD, [facility]  [facility]  [place]  [facility]  V[facility] at [facility] at [facility] [address], [address] [place], [place] [address] [phone]

## 2023-09-14 NOTE — Patient Instructions (Signed)
Medication Instructions:  Your physician recommends that you continue on your current medications as directed. Please refer to the Current Medication list given to you today.  *If you need a refill on your cardiac medications before your next appointment, please call your pharmacy*  Lab Work: NONE  Testing/Procedures: NONE  Follow-Up: At [facility], you and your health needs are our priority.  As part of our continuing mission to provide you with exceptional heart care, we have created designated Provider Care Teams.  These Care Teams include your primary Cardiologist (physician) and Advanced Practice Providers (APPs -  Physician Assistants and Nurse Practitioners) who all work together to provide you with the care you need, when you need it.  We recommend signing up for the patient portal called "MyChart".  Sign up information is provided on this After Visit Summary.  MyChart is used to connect with patients for Virtual Visits (Telemedicine).  Patients are able to view lab/test results, encounter notes, upcoming appointments, etc.  Non-urgent messages can be sent to your provider as well.   To learn more about what you can do with MyChart, go to [email or web address].    Your next appointment:   As needed

## 2023-09-15 DIAGNOSIS — I493 Ventricular premature depolarization: Secondary | ICD-10-CM

## 2023-09-17 ENCOUNTER — Telehealth: Payer: Self-pay | Admitting: *Deleted

## 2023-09-17 NOTE — Telephone Encounter (Signed)
Please call Dezarai Prew in monitor department at [phone].    According to [employer], monitor was delivered and pending return. Confirm shipping address with patient and have another sent out if he did not receive yet.

## 2023-09-17 NOTE — Telephone Encounter (Signed)
-----   Message from [facility] sent at 09/14/2023  6:30 PM EST ----- Regarding: monitor f/u Hi Christian Aguirre. This patient was ordered a monitor after ER visit, and it shows as exam begun, but patient states he has not received the monitor. I told him to check all his mail again, but can you confirm whether this was sent/marked as received? Thank you!

## 2023-09-18 ENCOUNTER — Telehealth: Payer: Self-pay | Admitting: Cardiology

## 2023-09-18 ENCOUNTER — Ambulatory Visit (HOSPITAL_BASED_OUTPATIENT_CLINIC_OR_DEPARTMENT_OTHER): Payer: BC Managed Care – PPO | Admitting: Family

## 2023-09-18 ENCOUNTER — Telehealth (HOSPITAL_BASED_OUTPATIENT_CLINIC_OR_DEPARTMENT_OTHER): Payer: Self-pay | Admitting: Cardiology

## 2023-09-18 MED ORDER — PROPRANOLOL HCL 20 MG PO TABS: ORAL_TABLET | ORAL | 2 refills | Status: AC

## 2023-09-18 NOTE — Telephone Encounter (Signed)
Gave patient instructions on how to send his ZIO XT monitor to [facility] for processing.

## 2023-09-18 NOTE — Telephone Encounter (Signed)
Previously mailed 30 day monitor for further evaluation of palpitations. If he did not receive, needs to call Burnett Harry in monitor department at [phone] to have new monitor sent out.   Ensure taking Propranolol 20mg  bid, he may take an extra dose as needed for breakthrough palpitations. Okay to provide updated Rx 'one tablet BID with additional tablet PRN' with 45 tablets and 2 refills.    Would recommend follow up after 30 day monitor results returned for most information available at visit. If requesting sooner visit, could be seen sooner but unlikely available.  Alver Sorrow, NP

## 2023-09-18 NOTE — Telephone Encounter (Signed)
New Message:     Patient said he saw Dr Cristal Deer on 09-14-23. He says he really wants to talk to her, he says he have so much going on. He said he did not know if the nurse could help him. So please give him a call.

## 2023-09-18 NOTE — Telephone Encounter (Signed)
Pt is calling in to let us know he received his heart monitor Sat.

## 2023-09-18 NOTE — Telephone Encounter (Signed)
Called patient, he stated he is feeling his heart beating really fast that went on all last night. He's sweating and felt dizzy to the point where he almost fell. He states he takes all his medications on a daily basis. He saw his eye doctor today (awaiting eye surgery for 10/01/23), systolic BP 148, patient unable to remember dystolic number. He is to be seen by Dr. Cristal Deer on a PRN basis as of 11/15. Patient requested to be seen soon, if no avail dates, he will go to ED to be evaluated.   Told patient I will route these complaints to Dr. Cristal Deer and APP for review and will call back with recommendations. Patient thankful for call.

## 2023-09-18 NOTE — Telephone Encounter (Signed)
Returned call to patient and verified that he has his monitor, he stated he started it on Saturday; informed that monitor department tried contacting him to verify monitor was sent to correct address.  Patient extremely anxious at time of call. Informed of recommendations per APP and that monitor results will be needed at time of visit to have more information at hand. After monitor results have been completed, appointment will be made for follow up. He verbalized understanding and medications have been sent to his preferred pharmacy.

## 2023-09-20 ENCOUNTER — Telehealth (HOSPITAL_BASED_OUTPATIENT_CLINIC_OR_DEPARTMENT_OTHER): Payer: Self-pay | Admitting: Cardiology

## 2023-09-20 NOTE — Telephone Encounter (Signed)
Patient came in office  Has been having chest pains about every hour that last 15-30 seconds Pains come randomly, no associated with exertion has at rest  Used to be sticking/stabbing pain, now like "hard blunt pain"  Have been having issues where his heart has been pounding and beating fast  Returned monitor on Tuesday, still in transit  Nothing relieves pain  Night sweats Headaches, no relief with Advil  Discussed with Dr Cristal Deer, Cardiac CT reassuring no significant blockages Go to ED for severe and sustained pain  Advised patient of recommendations  Tried reassuring patient of CT and fact that pain last less than 1 minute Reviewed deep breathing exercises with patient for fast/pounding heart beat

## 2023-09-20 NOTE — Telephone Encounter (Signed)
   Pt c/o of Chest Pain: STAT if active CP, including tightness, pressure, jaw pain, radiating pain to shoulder/upper arm/back, CP unrelieved by Nitro. Symptoms reported of SOB, nausea, vomiting, sweating.  1. Are you having CP right now?  Yes  2. Are you experiencing any other symptoms (ex. SOB, nausea, vomiting, sweating)?   Nausea  3. Is your CP continuous or coming and going?   Coming and going  4. Have you taken Nitroglycerin?  No  5. How long have you been experiencing CP? Has been going on for a while, at least two weeks  6. If NO CP at time of call then end call with telling Pt to call back or call 911 if Chest pain returns prior to return call from triage team.   Patient called to let Christian Aguirre know that his symptoms has gotten worse. Patient stated his heart is really racing.

## 2023-09-23 ENCOUNTER — Emergency Department (HOSPITAL_BASED_OUTPATIENT_CLINIC_OR_DEPARTMENT_OTHER): Payer: BC Managed Care – PPO

## 2023-09-23 ENCOUNTER — Emergency Department (HOSPITAL_BASED_OUTPATIENT_CLINIC_OR_DEPARTMENT_OTHER)
Admission: EM | Admit: 2023-09-23 | Discharge: 2023-09-23 | Disposition: A | Discharge status: Home / Self Care | Payer: BC Managed Care – PPO | Attending: Emergency Medicine | Admitting: Emergency Medicine

## 2023-09-23 ENCOUNTER — Emergency Department (HOSPITAL_BASED_OUTPATIENT_CLINIC_OR_DEPARTMENT_OTHER): Payer: BC Managed Care – PPO | Admitting: Radiology

## 2023-09-23 ENCOUNTER — Encounter (HOSPITAL_BASED_OUTPATIENT_CLINIC_OR_DEPARTMENT_OTHER): Payer: Self-pay

## 2023-09-23 DIAGNOSIS — I10 Essential (primary) hypertension: Secondary | ICD-10-CM | POA: Insufficient documentation

## 2023-09-23 DIAGNOSIS — N289 Disorder of kidney and ureter, unspecified: Secondary | ICD-10-CM

## 2023-09-23 DIAGNOSIS — M546 Pain in thoracic spine: Secondary | ICD-10-CM | POA: Diagnosis not present

## 2023-09-23 DIAGNOSIS — R42 Dizziness and giddiness: Secondary | ICD-10-CM | POA: Insufficient documentation

## 2023-09-23 DIAGNOSIS — R079 Chest pain, unspecified: Secondary | ICD-10-CM

## 2023-09-23 DIAGNOSIS — R109 Unspecified abdominal pain: Secondary | ICD-10-CM | POA: Diagnosis not present

## 2023-09-23 DIAGNOSIS — Z7984 Long term (current) use of oral hypoglycemic drugs: Secondary | ICD-10-CM | POA: Diagnosis not present

## 2023-09-23 DIAGNOSIS — N2889 Other specified disorders of kidney and ureter: Secondary | ICD-10-CM | POA: Diagnosis not present

## 2023-09-23 DIAGNOSIS — R0789 Other chest pain: Secondary | ICD-10-CM | POA: Diagnosis present

## 2023-09-23 DIAGNOSIS — Z79899 Other long term (current) drug therapy: Secondary | ICD-10-CM | POA: Diagnosis not present

## 2023-09-23 DIAGNOSIS — E119 Type 2 diabetes mellitus without complications: Secondary | ICD-10-CM | POA: Insufficient documentation

## 2023-09-23 LAB — TROPONIN I (HIGH SENSITIVITY)
Troponin I (High Sensitivity): 4 ng/L (ref <18)
Troponin I (High Sensitivity): 4 ng/L (ref <18)

## 2023-09-23 LAB — URINALYSIS, ROUTINE W REFLEX MICROSCOPIC
Bacteria, UA: NONE SEEN
Bilirubin Urine: NEGATIVE
Glucose, UA: NEGATIVE mg/dL
Hgb urine dipstick: NEGATIVE
Ketones, ur: NEGATIVE mg/dL
Leukocytes,Ua: NEGATIVE
Nitrite: NEGATIVE
Protein, ur: NEGATIVE mg/dL
Specific Gravity, Urine: 1.023 (ref 1.005–1.030)
pH: 6 (ref 5.0–8.0)

## 2023-09-23 LAB — COMPREHENSIVE METABOLIC PANEL
ALT: 24 U/L (ref 0–44)
AST: 16 U/L (ref 15–41)
Albumin: 4.8 g/dL (ref 3.5–5.0)
Alkaline Phosphatase: 45 U/L (ref 38–126)
Anion gap: 8 (ref 5–15)
BUN: 14 mg/dL (ref 8–23)
CO2: 30 mmol/L (ref 22–32)
Calcium: 9.6 mg/dL (ref 8.9–10.3)
Chloride: 102 mmol/L (ref 98–111)
Creatinine, Ser: 0.84 mg/dL (ref 0.61–1.24)
GFR, Estimated: >60 mL/min (ref >60)
Glucose, Bld: 101 mg/dL — ABNORMAL HIGH (ref 70–99)
Potassium: 3.5 mmol/L (ref 3.5–5.1)
Sodium: 140 mmol/L (ref 135–145)
Total Bilirubin: 0.7 mg/dL (ref <1.2)
Total Protein: 7.7 g/dL (ref 6.5–8.1)

## 2023-09-23 LAB — CBC
HCT: 41 % (ref 39.0–52.0)
Hemoglobin: 13.9 g/dL (ref 13.0–17.0)
MCH: 29.7 pg (ref 26.0–34.0)
MCHC: 33.9 g/dL (ref 30.0–36.0)
MCV: 87.6 fL (ref 80.0–100.0)
Platelets: 288 10*3/uL (ref 150–400)
RBC: 4.68 MIL/uL (ref 4.22–5.81)
RDW: 11.9 % (ref 11.5–15.5)
WBC: 4.5 10*3/uL (ref 4.0–10.5)
nRBC: 0 % (ref 0.0–0.2)

## 2023-09-23 LAB — LIPASE, BLOOD: Lipase: 35 U/L (ref 11–51)

## 2023-09-23 MED ORDER — IOHEXOL 350 MG/ML SOLN
100.0000 mL | Freq: Once | INTRAVENOUS | Status: AC | PRN
  Administered 2023-09-23: 100 mL via INTRAVENOUS

## 2023-09-23 NOTE — ED Provider Notes (Signed)
[facility] Provider Note   CSN: [id number] Arrival date & time: 09/23/23  1023     History  Chief Complaint  Patient presents with   Chest Pain    Christian Aguirre is a 62 y.o. male.  HPI     62 year old male with a history of hypertension, type 2 diabetes, atypical chest pain who presents with concern for episode of lightheadedness, continued chest pain.    Throbbing pain has been the same, like a nerve pain on left, can feel it in back of shoulder, constant, pain in center of chest.   No shortness of breath. Nausea, no vomiting.  No abdominal pain.  No known fever, no cough.    Today lightheadedness, near-syncope, lasted until arrival here, still feeling it a little bit. No injuries, does jog and do sit ups a lot Feels pain left side going from LUQ of abdomen along lateral side to shoulder and pain radiating to the back of the shoulder/upper thoracic back Had brief tingling to left arm with symptoms, otherwise no numbness/weakness/change in vision/facial droop/difficulty talking or walking Occasional right flank pain, foamy urine    Had a coronary CTA December 2023 which revealed a coronary calcium score of 7, evidence of nonobstructive CAD.  Saw cardiology on November 15--they noted that he had palpable muscle knots to reproduce his pain and CT showed nonobstructive CAD.  They reviewed his prior Zio patch which was reassuring, and noted that he was to receive another cardiac monitor.   Past Medical History:  Diagnosis Date   Diabetes mellitus without complication (HCC)    ETOH abuse    Hypertension      Home Medications Prior to Admission medications   Medication Sig Start Date End Date Taking? Authorizing Provider  ALPRAZolam Prudy Feeler) 0.5 MG tablet Take 0.5 tablets (0.25 mg total) by mouth at bedtime as needed for anxiety. 12/17/17   Revankar, Aundra Dubin, MD  amLODipine (NORVASC) 10 MG tablet Take 10 mg by mouth daily.     [provider]  aspirin EC 81 MG tablet Take 1 tablet (81 mg total) by mouth daily. Swallow whole. 10/10/22   Jodelle Red, MD  atorvastatin (LIPITOR) 20 MG tablet Take 20 mg by mouth daily.    [provider]  busPIRone (BUSPAR) 10 MG tablet Take 10 mg by mouth 3 (three) times daily. 09/10/23 10/10/23  [provider]  CONTOUR TEST test strip 1 each by Other route 2 (two) times daily.    [provider]  hydrOXYzine (ATARAX) 50 MG tablet Take 50 mg by mouth 3 (three) times daily as needed.    [provider]  lisinopril (PRINIVIL,ZESTRIL) 20 MG tablet Take 20 mg by mouth daily.    [provider]  metFORMIN (GLUCOPHAGE) 1000 MG tablet Take 1,000 mg by mouth daily with breakfast.    [provider]  nitroGLYCERIN (NITROSTAT) 0.4 MG SL tablet Place 1 tablet (0.4 mg total) under the tongue every 5 (five) minutes as needed. 12/17/17 09/14/22  Revankar, Aundra Dubin, MD  prednisoLONE acetate (PRED FORTE) 1 % ophthalmic suspension Place 1 drop into the left eye 4 (four) times daily.    [provider]  propranolol (INDERAL) 20 MG tablet Take 1 tablet (20 mg total) by mouth 2 (two) times daily. May also take 1 tablet (20 mg total) as needed (PALPITATIONS). 09/18/23   Alver Sorrow, NP  simvastatin (ZOCOR) 10 MG tablet Take 10 mg by mouth at bedtime.  [provider]      Allergies    Patient has no known allergies.    Review of Systems   Review of Systems  Physical Exam Updated Vital Signs BP 127/89   Pulse 63   Temp 98.5 F (36.9 C) (Oral)   Resp 13   SpO2 99%  Physical Exam Vitals and nursing note reviewed.  Constitutional:      General: He is not in acute distress.    Appearance: Normal appearance. He is well-developed. He is not ill-appearing or diaphoretic.  HENT:     Head: Normocephalic and atraumatic.  Eyes:     General: No visual field deficit.    Extraocular Movements: Extraocular  movements intact.     Conjunctiva/sclera: Conjunctivae normal.     Pupils: Pupils are equal, round, and reactive to light.  Cardiovascular:     Rate and Rhythm: Normal rate and regular rhythm.     Pulses: Normal pulses.     Heart sounds: Normal heart sounds. No murmur heard.    No friction rub. No gallop.  Pulmonary:     Effort: Pulmonary effort is normal. No respiratory distress.     Breath sounds: Normal breath sounds. No wheezing or rales.  Abdominal:     General: There is no distension.     Palpations: Abdomen is soft.     Tenderness: There is no abdominal tenderness. There is no guarding.  Musculoskeletal:        General: No swelling or tenderness.     Cervical back: Normal range of motion.  Skin:    General: Skin is warm and dry.     Findings: No erythema or rash.  Neurological:     General: No focal deficit present.     Mental Status: He is alert and oriented to person, place, and time.     GCS: GCS eye subscore is 4. GCS verbal subscore is 5. GCS motor subscore is 6.     Cranial Nerves: No cranial nerve deficit, dysarthria or facial asymmetry.     Sensory: No sensory deficit.     Motor: No weakness or tremor.     Coordination: Coordination normal. Finger-Nose-Finger Test normal.     Gait: Gait normal.     ED Results / Procedures / Treatments   Labs (all labs ordered are listed, but only abnormal results are displayed) Labs Reviewed  COMPREHENSIVE METABOLIC PANEL - Abnormal; Notable for the following components:      Result Value   Glucose, Bld 101 (*)    All other components within normal limits  LIPASE, BLOOD  CBC  URINALYSIS, ROUTINE W REFLEX MICROSCOPIC  TROPONIN I (HIGH SENSITIVITY)  TROPONIN I (HIGH SENSITIVITY)    EKG None  Radiology CT Angio Chest/Abd/Pel for Dissection W and/or Wo Contrast  Result Date: 09/23/2023 CLINICAL DATA:  Intermittent upper left chest pain radiating to the left neck for the past 3 weeks. EXAM: CT ANGIOGRAPHY CHEST,  ABDOMEN AND PELVIS TECHNIQUE: Non-contrast CT of the chest was initially obtained. Multidetector CT imaging through the chest, abdomen and pelvis was performed using the standard protocol during bolus administration of intravenous contrast. Multiplanar reconstructed images and MIPs were obtained and reviewed to evaluate the vascular anatomy. RADIATION DOSE REDUCTION: This exam was performed according to the departmental dose-optimization program which includes automated exposure control, adjustment of the mA and/or kV according to patient size and/or use of iterative reconstruction technique. CONTRAST:  OMNIPAQUE IOHEXOL 350 MG/ML SOLN COMPARISON:  Coronary CTA dated  September 29, 2022. FINDINGS: CTA CHEST FINDINGS Cardiovascular: Precontrast images demonstrate no thoracic aortic intramural hematoma. Preferential opacification of the thoracic aorta. No evidence of thoracic aortic aneurysm or dissection. Normal heart size. No pericardial effusion. No pulmonary embolism. Mediastinum/Nodes: No enlarged mediastinal, hilar, or axillary lymph nodes. Thyroid gland, trachea, and esophagus demonstrate no significant findings. Lungs/Pleura: Lungs are clear. No pleural effusion or pneumothorax. Musculoskeletal: No chest wall abnormality. No acute or significant osseous findings. Review of the MIP images confirms the above findings. CTA ABDOMEN AND PELVIS FINDINGS VASCULAR Aorta: Normal caliber aorta without aneurysm, dissection, vasculitis or significant stenosis. Celiac: Patent without evidence of aneurysm, dissection, vasculitis or significant stenosis. SMA: Patent without evidence of aneurysm, dissection, vasculitis or significant stenosis. Renals: Both renal arteries are patent without evidence of aneurysm, dissection, vasculitis, fibromuscular dysplasia or significant stenosis. IMA: Patent without evidence of aneurysm, dissection, vasculitis or significant stenosis. Inflow: Patent without evidence of aneurysm,  dissection, vasculitis or significant stenosis. Veins: No obvious venous abnormality within the limitations of this arterial phase study. Review of the MIP images confirms the above findings. NON-VASCULAR Hepatobiliary: No focal liver abnormality is seen. No gallstones, gallbladder wall thickening, or biliary dilatation. Pancreas: Unremarkable. No pancreatic ductal dilatation or surrounding inflammatory changes. Spleen: Normal in size without focal abnormality. Adrenals/Urinary Tract: Adrenal glands are unremarkable. 1.2 cm indeterminate lesion in the peripheral right kidney, measuring 46 Hounsfield units in density on this single arterial phase study (series 5, image 176). Normal left kidney. No renal calculi or hydronephrosis. The bladder is unremarkable. Stomach/Bowel: Stomach is within normal limits. Appendix appears normal. No evidence of bowel wall thickening, distention, or inflammatory changes. Lymphatic: No enlarged abdominal or pelvic lymph nodes. Reproductive: Prostate is unremarkable. Other: Tiny fat containing umbilical hernia. No free fluid or pneumoperitoneum Musculoskeletal: No acute or significant osseous findings. Review of the MIP images confirms the above findings. IMPRESSION: 1. No acute intrathoracic or intra-abdominal process. No aortic dissection or aneurysm. 2. 1.2 cm indeterminate lesion in the peripheral right kidney. Recommend further evaluation with outpatient renal protocol MRI with and without contrast. Electronically Signed   By: Obie Dredge M.D.   On: 09/23/2023 13:32   DG Chest 2 View  Result Date: 09/23/2023 CLINICAL DATA:  Chest pain. EXAM: CHEST - 2 VIEW COMPARISON:  09/13/2023. FINDINGS: Bilateral lung fields are clear. Bilateral costophrenic angles are clear. Normal cardio-mediastinal silhouette. No acute osseous abnormalities. The soft tissues are within normal limits. IMPRESSION: No active cardiopulmonary disease. Electronically Signed   By: Jules Schick M.D.   On:  09/23/2023 11:21    Procedures Procedures    Medications Ordered in ED Medications  iohexol (OMNIPAQUE) 350 MG/ML injection 100 mL (100 mLs Intravenous Contrast Given 09/23/23 1233)    ED Course/ Medical Decision Making/ A&P                                  62 year old male with a history of hypertension, type 2 diabetes, atypical chest pain who presents with concern for episode of lightheadedness, continued chest pain.   Differential diagnosis for chest pain includes pulmonary embolus, dissection, pneumothorax, pneumonia, ACS, myocarditis, pericarditis.    EKG was done and evaluate by me and showed no acute ST changes and no signs of pericarditis.   Chest x-ray was done and evaluated by me and radiology and showed no sign of pneumonia or pneumothorax.  Labs completed and evaluated by me show no evidence of  clinically significant anemia, electrolyte abnormalities, no sign of pancreatitis or hepatitis as etiology of abominal pain. UA negative/without infection.  Troponin negative x2 an doubt ACS.  Atypical features of pain, negative troponin, CT coronary 09/2022 without obstructive disease and recent cardiology evaluation.  CT dissection study ordered given pain with radiation to back, arm, involving left upper quadrant-  CT shows no dissection or other acute abnormality as etiology of pain.  CT does show lesion right kidney and recommend renal protocol MRI WWO contrast.   Suspect pain may be from low c or high t spine and or muscular spasm.   For lightheadedness--consider vagal episode in setting of pain. Normal neurologic exam, lightheadedness description, no sign of CVA.  Had recent cardiac monitor and awaiting results.  Recommend PCP follow up for pain, obtain MRI renal protocol WWO contrast. May follow up with Cardiology. Patient discharged in stable condition with understanding of reasons to return.         Final Clinical Impression(s) / ED Diagnoses Final diagnoses:   Chest pain, unspecified type  Left sided abdominal pain  Acute left-sided thoracic back pain  Renal lesion  Lightheadedness    Rx / DC Orders ED Discharge Orders     None         Alvira Monday, MD 09/23/23 [phone]

## 2023-09-23 NOTE — ED Triage Notes (Signed)
He c/o upper left-sided chest pain, which is intermittent and, at times radiates up into left neck area x 3 weeks. He recently was seen by a cardiologist, who assured him "it wasn't my heart". He further states "I think it's my spleen". He is ambulatory and in no distress. Skin is normal, warm and dry and he is breathing normally.

## 2023-09-23 NOTE — ED Notes (Signed)
Patient transported to X-ray 

## 2023-09-25 ENCOUNTER — Ambulatory Visit (INDEPENDENT_AMBULATORY_CARE_PROVIDER_SITE_OTHER): Payer: BC Managed Care – PPO | Admitting: Internal Medicine

## 2023-09-25 ENCOUNTER — Encounter: Payer: Self-pay | Admitting: Internal Medicine

## 2023-09-25 VITALS — BP 136/84 | HR 65 | Ht 72.0 in | Wt 216.0 lb

## 2023-09-25 DIAGNOSIS — Z7984 Long term (current) use of oral hypoglycemic drugs: Secondary | ICD-10-CM

## 2023-09-25 DIAGNOSIS — R002 Palpitations: Secondary | ICD-10-CM | POA: Diagnosis not present

## 2023-09-25 DIAGNOSIS — E1159 Type 2 diabetes mellitus with other circulatory complications: Secondary | ICD-10-CM

## 2023-09-25 DIAGNOSIS — E119 Type 2 diabetes mellitus without complications: Secondary | ICD-10-CM | POA: Insufficient documentation

## 2023-09-25 LAB — COLOGUARD: COLOGUARD: NEGATIVE

## 2023-09-25 LAB — POCT GLUCOSE (DEVICE FOR HOME USE): Glucose Fasting, POC: 122 mg/dL — AB (ref 70–99)

## 2023-09-25 LAB — POCT GLYCOSYLATED HEMOGLOBIN (HGB A1C): Hemoglobin A1C: 5.8 % — AB (ref 4.0–5.6)

## 2023-09-25 NOTE — Patient Instructions (Addendum)
A1c 5.8%, this is perfect, continue metformin   I will turn over your diabetes care  to your primary care physician, we will be happy to see you again if needed in the future

## 2023-09-25 NOTE — Progress Notes (Signed)
Name: Christian Aguirre  MRN/ DOB: [id number], 03-19-1961   Age/ Sex: 62 y.o., male    PCP: Pcp, No   Reason for Endocrinology Evaluation: Type 2 Diabetes Mellitus     Date of Initial Endocrinology Visit: 09/25/2023     PATIENT IDENTIFIER: Christian Aguirre is a 62 y.o. male with a past medical history of HTN, DM, Hx alcohol abuse and dyslipidemia. The patient presented for initial endocrinology clinic visit on 09/25/2023 for consultative assistance with his diabetes management.    HPI: Christian Aguirre was    Diagnosed with DM ~ 5 yrs ago  Currently checking blood sugars occasionally  Hypoglycemia episodes : no              Hemoglobin A1c has ranged from 5.9% in 2024, peaking at 6.9% in 2022.  In terms of diet, the patient 1-2 meals a day, snack occasionally. Does not drinks   Denies nausea, or vomiting  Has alternating changes in bowel movement.   Follows with cardiology for chest pain, palpitations associated with episodes of feeling dizzy and clammy.  Has a pending heart monitor results.  Patient has been diagnosed with anxiety and is on anxiolytic medications, which the patient does admit they help with the symptoms    HOME DIABETES REGIMEN: Metformin 1000 mg    Statin: Yes ACE-I/ARB: Yes   METER DOWNLOAD SUMMARY: n/a    DIABETIC COMPLICATIONS: Microvascular complications:   Denies: CKD Last eye exam: Completed 09/18/2023  Macrovascular complications:  Nonobstructive CAD Denies:  PVD, CVA   PAST HISTORY: Past Medical History:  Past Medical History:  Diagnosis Date   Diabetes mellitus without complication (HCC)    ETOH abuse    Hypertension    Past Surgical History:  Past Surgical History:  Procedure Laterality Date   EYE SURGERY      Social History:  reports that he has never smoked. He has never used smokeless tobacco. He reports current alcohol use. He reports that he does not currently use drugs after having used the following drugs:  Marijuana. Family History: No family history on file.   HOME MEDICATIONS: Allergies as of 09/25/2023   No Known Allergies      Medication List        Accurate as of September 25, 2023  9:21 AM. If you have any questions, ask your nurse or doctor.          ALPRAZolam 0.5 MG tablet Commonly known as: XANAX Take 0.5 tablets (0.25 mg total) by mouth at bedtime as needed for anxiety.   amLODipine 10 MG tablet Commonly known as: NORVASC Take 10 mg by mouth daily.   aspirin EC 81 MG tablet Take 1 tablet (81 mg total) by mouth daily. Swallow whole.   atorvastatin 20 MG tablet Commonly known as: LIPITOR Take 20 mg by mouth daily.   busPIRone 10 MG tablet Commonly known as: BUSPAR Take 10 mg by mouth 3 (three) times daily.   Contour Test test strip Generic drug: glucose blood 1 each by Other route 2 (two) times daily.   hydrOXYzine 50 MG tablet Commonly known as: ATARAX Take 50 mg by mouth 3 (three) times daily as needed.   lisinopril 20 MG tablet Commonly known as: ZESTRIL Take 20 mg by mouth daily.   metFORMIN 1000 MG tablet Commonly known as: GLUCOPHAGE Take 1,000 mg by mouth daily with breakfast.   nitroGLYCERIN 0.4 MG SL tablet Commonly known as: NITROSTAT Place 1 tablet (0.4 mg total) under the  tongue every 5 (five) minutes as needed.   prednisoLONE acetate 1 % ophthalmic suspension Commonly known as: PRED FORTE Place 1 drop into the left eye 4 (four) times daily.   propranolol 20 MG tablet Commonly known as: INDERAL Take 1 tablet (20 mg total) by mouth 2 (two) times daily. May also take 1 tablet (20 mg total) as needed (PALPITATIONS).   simvastatin 10 MG tablet Commonly known as: ZOCOR Take 10 mg by mouth at bedtime.         ALLERGIES: No Known Allergies   REVIEW OF SYSTEMS: A comprehensive ROS was conducted with the patient and is negative except as per HPI    OBJECTIVE:   VITAL SIGNS: BP 136/84 (BP Location: Left Arm, Patient  Position: Sitting, Cuff Size: Small)   Pulse 65   Ht 6' (1.829 m)   Wt 216 lb (98 kg)   SpO2 98%   BMI 29.29 kg/m    PHYSICAL EXAM:  General: Pt appears well and is in NAD  Neck: Thyroid: Thyroid size normal.  No goiter or nodules appreciated.   Lungs: Clear with good BS bilat   Heart: RRR   Abdomen:  soft, nontender  Extremities:  Lower extremities - No pretibial edema.   Neuro: MS is good with appropriate affect, pt is alert and Ox3    DM foot exam: 09/25/2023  The skin of the feet is intact without sores or ulcerations. The pedal pulses are 2+ on right and 2+ on left. The sensation is intact to a screening 5.07, 10 gram monofilament bilaterally   DATA REVIEWED:  06/14/2023 @ [facility]  A1c 6.4% Ma/Cr ratio 11    Latest Reference Range & Units 09/23/23 10:39  COMPREHENSIVE METABOLIC PANEL  Rpt !  Sodium 135 - 145 mmol/L 140  Potassium 3.5 - 5.1 mmol/L 3.5  Chloride 98 - 111 mmol/L 102  CO2 22 - 32 mmol/L 30  Glucose 70 - 99 mg/dL 324 (H)  BUN 8 - 23 mg/dL 14  Creatinine 4.01 - 0.27 mg/dL 2.53  Calcium 8.9 - 66.4 mg/dL 9.6  Anion gap 5 - 15  8  Alkaline Phosphatase 38 - 126 U/L 45  Albumin 3.5 - 5.0 g/dL 4.8  Lipase 11 - 51 U/L 35  AST 15 - 41 U/L 16  ALT 0 - 44 U/L 24  Total Protein 6.5 - 8.1 g/dL 7.7  Total Bilirubin <4.0 mg/dL 0.7  GFR, Estimated >34 mL/min >60    Old records , labs and images have been reviewed.       ASSESSMENT / PLAN / RECOMMENDATIONS:   1) Type 2 Diabetes Mellitus, Optimally controlled, With macrovascular complications - Most recent A1c of 5.8 %. Goal A1c < 7.0 %.    -A1c is optimal -I have encouraged the patient to continue with lifestyle changes  MEDICATIONS: Continue metformin 1000 mg daily  EDUCATION / INSTRUCTIONS: BG monitoring instructions: Patient is instructed to check his blood sugars 2-3 times a week. Call [facility] if: BG persistently < 70  I reviewed the Rule of 15 for the treatment of  hypoglycemia in detail with the patient. Literature supplied.   2) Diabetic complications:  Eye: Does not have known diabetic retinopathy.  Neuro/ Feet: Does not have known diabetic peripheral neuropathy. Renal: Patient does not have known baseline CKD. He is  on an ACEI/ARB at present.  3) Palpitations:  -He is under the care of cardiology for further evaluation of palpitations, and chest pain -I will proceed  with catecholamine checkup   Recommend continuation of current Tx with primary MD and consultative f/u at [facility] in the future if pt's DM control becomes problematic.   Signed electronically by: Lyndle Herrlich, MD  [facility]  [employer] [address] [place], [place] [address] Phone: [phone] FAX: [phone]   CC: Pcp, No No address on file Phone: None  Fax: None    Return to Endocrinology clinic as below: Future Appointments  Date Time Provider Department Center  09/25/2023  9:30 AM Michaela Broski, Konrad Dolores, MD [id number] None

## 2023-09-26 ENCOUNTER — Telehealth: Payer: Self-pay | Admitting: Cardiology

## 2023-09-26 NOTE — Telephone Encounter (Signed)
   Pre-operative Risk Assessment    Patient Name: Christian Aguirre  DOB: 08-Aug-1961 MRN: [id number]      Request for Surgical Clearance    Procedure:   A secondary lens implant   Date of Surgery:  Clearance 10/01/23                                 Surgeon:  Dr. Theodosia Paling Group or Practice Name:  [facility]  Phone number:  [phone] Fax number:  [phone]   Type of Clearance Requested:   - Medical    Type of Anesthesia:  General    Additional requests/questions:      SignedEmilie Rutter   09/26/2023, 9:15 AM

## 2023-09-26 NOTE — Telephone Encounter (Signed)
   Patient Name: TALLIE MARITATO  DOB: 1961/03/04 MRN: [id number]  Primary Cardiologist: Jodelle Red, MD  Chart reviewed as part of pre-operative protocol coverage. Cataract extractions are recognized in guidelines as low risk surgeries that do not typically require specific preoperative testing or holding of blood thinner therapy. Therefore, given past medical history and time since last visit, based on ACC/AHA guidelines, DEVIAN TATA would be at acceptable risk for the planned procedure without further cardiovascular testing.   I will route this recommendation to the requesting party via Epic fax function and remove from pre-op pool.  Please call with questions.  Ronney Asters, NP 09/26/2023, 2:23 PM

## 2023-10-04 LAB — CATECHOLAMINES, FRACTIONATED, PLASMA
Dopamine: <10 pg/mL
Epinephrine: 33 pg/mL
Norepinephrine: 454 pg/mL
Total Catecholamines: <497 pg/mL

## 2023-10-10 ENCOUNTER — Encounter: Payer: Self-pay | Admitting: Internal Medicine

## 2023-10-18 ENCOUNTER — Telehealth (HOSPITAL_BASED_OUTPATIENT_CLINIC_OR_DEPARTMENT_OTHER): Payer: Self-pay | Admitting: Cardiology

## 2023-10-18 NOTE — Telephone Encounter (Signed)
Left message to call back  Dr Cristal Deer not in office today

## 2023-10-18 NOTE — Telephone Encounter (Signed)
Follow Up:      Patient is calling  to see if his Monitor results are ready? Patient said, If he does not answer, please leave a message.

## 2023-10-19 NOTE — Telephone Encounter (Signed)
Called and spoke to patient. Discussed heart monitor results. He verbalized understanding.   Monitor prelim report reviewed. Predominantly normal sinus rhythm. Rare early beats occurring <1% of the time which are not dangerous and not of concern. No significant arrhythmias. Good result!    Alver Sorrow, NP

## 2023-10-19 NOTE — Telephone Encounter (Signed)
Patient called to follow-up on heart monitor test results and noted he will be having eye surgery on Monday.

## 2023-10-19 NOTE — Telephone Encounter (Signed)
Monitor prelim report reviewed. Predominantly normal sinus rhythm. Rare early beats occurring <1% of the time which are not dangerous and not of concern. No significant arrhythmias. Good result!   Alver Sorrow, NP

## 2023-11-15 ENCOUNTER — Other Ambulatory Visit: Payer: Self-pay

## 2023-11-15 ENCOUNTER — Emergency Department (HOSPITAL_BASED_OUTPATIENT_CLINIC_OR_DEPARTMENT_OTHER)
Admission: EM | Admit: 2023-11-15 | Discharge: 2023-11-15 | Disposition: A | Discharge status: Home / Self Care | Payer: BC Managed Care – PPO | Attending: Emergency Medicine | Admitting: Emergency Medicine

## 2023-11-15 ENCOUNTER — Emergency Department (HOSPITAL_BASED_OUTPATIENT_CLINIC_OR_DEPARTMENT_OTHER): Payer: BC Managed Care – PPO

## 2023-11-15 DIAGNOSIS — R109 Unspecified abdominal pain: Secondary | ICD-10-CM | POA: Diagnosis present

## 2023-11-15 DIAGNOSIS — Z7982 Long term (current) use of aspirin: Secondary | ICD-10-CM | POA: Diagnosis not present

## 2023-11-15 LAB — BASIC METABOLIC PANEL WITH GFR
Anion gap: 7 (ref 5–15)
BUN: 13 mg/dL (ref 8–23)
CO2: 32 mmol/L (ref 22–32)
Calcium: 9.4 mg/dL (ref 8.9–10.3)
Chloride: 102 mmol/L (ref 98–111)
Creatinine, Ser: 0.94 mg/dL (ref 0.61–1.24)
GFR, Estimated: >60 mL/min (ref >60)
Glucose, Bld: 125 mg/dL — ABNORMAL HIGH (ref 70–99)
Potassium: 3.3 mmol/L — ABNORMAL LOW (ref 3.5–5.1)
Sodium: 141 mmol/L (ref 135–145)

## 2023-11-15 LAB — URINALYSIS, ROUTINE W REFLEX MICROSCOPIC
Bacteria, UA: NONE SEEN
Bilirubin Urine: NEGATIVE
Glucose, UA: NEGATIVE mg/dL
Hgb urine dipstick: NEGATIVE
Ketones, ur: NEGATIVE mg/dL
Leukocytes,Ua: NEGATIVE
Nitrite: NEGATIVE
Protein, ur: 30 mg/dL — AB
Specific Gravity, Urine: 1.029 (ref 1.005–1.030)
pH: 5.5 (ref 5.0–8.0)

## 2023-11-15 LAB — CBC WITH DIFFERENTIAL/PLATELET
Abs Immature Granulocytes: 0.01 K/uL (ref 0.00–0.07)
Basophils Absolute: 0 K/uL (ref 0.0–0.1)
Basophils Relative: 1 %
Eosinophils Absolute: 0.2 K/uL (ref 0.0–0.5)
Eosinophils Relative: 3 %
HCT: 41.1 % (ref 39.0–52.0)
Hemoglobin: 13.8 g/dL (ref 13.0–17.0)
Immature Granulocytes: 0 %
Lymphocytes Relative: 33 %
Lymphs Abs: 2 K/uL (ref 0.7–4.0)
MCH: 29.1 pg (ref 26.0–34.0)
MCHC: 33.6 g/dL (ref 30.0–36.0)
MCV: 86.5 fL (ref 80.0–100.0)
Monocytes Absolute: 0.5 K/uL (ref 0.1–1.0)
Monocytes Relative: 9 %
Neutro Abs: 3.1 K/uL (ref 1.7–7.7)
Neutrophils Relative %: 54 %
Platelets: 298 K/uL (ref 150–400)
RBC: 4.75 MIL/uL (ref 4.22–5.81)
RDW: 12 % (ref 11.5–15.5)
WBC: 5.9 K/uL (ref 4.0–10.5)
nRBC: 0 % (ref 0.0–0.2)

## 2023-11-15 LAB — HEPATIC FUNCTION PANEL
ALT: 22 U/L (ref 0–44)
AST: 16 U/L (ref 15–41)
Albumin: 4.7 g/dL (ref 3.5–5.0)
Alkaline Phosphatase: 47 U/L (ref 38–126)
Bilirubin, Direct: 0.1 mg/dL (ref 0.0–0.2)
Indirect Bilirubin: 0.5 mg/dL (ref 0.3–0.9)
Total Bilirubin: 0.6 mg/dL (ref 0.0–1.2)
Total Protein: 7.8 g/dL (ref 6.5–8.1)

## 2023-11-15 LAB — LIPASE, BLOOD: Lipase: 37 U/L (ref 11–51)

## 2023-11-15 MED ORDER — LIDOCAINE 5 % EX PTCH: 1.0000 | MEDICATED_PATCH | CUTANEOUS | 0 refills | Status: AC

## 2023-11-15 MED ORDER — CELECOXIB 100 MG PO CAPS: 200.0000 mg | ORAL_CAPSULE | Freq: Two times a day (BID) | ORAL | 0 refills | Status: AC

## 2023-11-15 MED ORDER — IOHEXOL 300 MG/ML  SOLN
100.0000 mL | Freq: Once | INTRAMUSCULAR | Status: AC | PRN
  Administered 2023-11-15: 100 mL via INTRAVENOUS

## 2023-11-15 MED ORDER — LIDOCAINE 5 % EX PTCH
1.0000 | MEDICATED_PATCH | CUTANEOUS | Status: DC
  Administered 2023-11-15: 1 via TRANSDERMAL
  Filled 2023-11-15: qty 1

## 2023-11-15 NOTE — ED Provider Notes (Signed)
[facility] EMERGENCY DEPARTMENT AT [facility] Provider Note   CSN: [id number] Arrival date & time: 11/15/23  1052     History  No chief complaint on file.   Christian Aguirre is a 63 y.o. male presents today with intermittent R flank pain.  Patient is also concerned that his blood pressure has been elevated which has been causing headaches due to the pain. Patient states when he takes 100 mg of ibuprofen it alleviates the pain but raises his blood pressure so he does not take it.  He states that Tylenol does not help with his pain.  Patient was recently diagnosed with a mass on his right kidney in the same area as his pain.  He is scheduled to have this likely cancerous area removed in the upcoming months.  Patient denies hematuria, dysuria, chest pain, shortness of breath, vision changes, nausea, vomiting, diarrhea.  HPI     Home Medications Prior to Admission medications   Medication Sig Start Date End Date Taking? Authorizing Provider  celecoxib (CELEBREX) 100 MG capsule Take 2 capsules (200 mg total) by mouth 2 (two) times daily for 10 days. 11/15/23 11/25/23 Yes Dolphus Jenny, PA-C  Difluprednate 0.05 % EMUL Place 1 drop into the left eye 4 (four) times daily. 11/13/23  Yes [provider]  DORZOLAMIDE HCL OP Apply 1 drop to eye 2 (two) times daily. *affected eye(s)* 11/01/23  Yes [provider]  dorzolamide-timolol (COSOPT) 2-0.5 % ophthalmic solution SMARTSIG:In Eye(s) 10/23/23  Yes [provider]  lidocaine (LIDODERM) 5 % Place 1 patch onto the skin daily. Remove & Discard patch within 12 hours or as directed by MD 11/15/23  Yes Dolphus Jenny, PA-C  neomycin-polymyxin b-dexamethasone (MAXITROL) 3.5-10000-0.1 OINT Apply to eye.  Apply 0.5 inches (0.5 g total) to left eye 3 (three) times a day as needed (eye discomfort). 11/13/23  Yes [provider]  sertraline (ZOLOFT) 100 MG tablet Take 1 tablet by mouth daily. 11/01/23 10/31/24 Yes [provider]  Timolol Maleate PF 0.5 % SOLN Apply 1 drop to eye 2 (two) times daily. *affected eye(s)* 10/30/23  Yes [provider]  ALPRAZolam (XANAX) 0.5 MG tablet Take 0.5 tablets (0.25 mg total) by mouth at bedtime as needed for anxiety. 12/17/17   Revankar, Aundra Dubin, MD  amLODipine (NORVASC) 10 MG tablet Take 10 mg by mouth daily.    [provider]  aspirin EC 81 MG tablet Take 1 tablet (81 mg total) by mouth daily. Swallow whole. 10/10/22   Jodelle Red, MD  atorvastatin (LIPITOR) 20 MG tablet Take 20 mg by mouth daily.    [provider]  CONTOUR TEST test strip 1 each by Other route 2 (two) times daily.    [provider]  hydrOXYzine (ATARAX) 50 MG tablet Take 50 mg by mouth 3 (three) times daily as needed.    [provider]  lisinopril (PRINIVIL,ZESTRIL) 20 MG tablet Take 20 mg by mouth daily.    [provider]  metFORMIN (GLUCOPHAGE) 1000 MG tablet Take 1,000 mg by mouth daily with breakfast.    [provider]  nitroGLYCERIN (NITROSTAT) 0.4 MG SL tablet Place 1 tablet (0.4 mg total) under the tongue every 5 (five) minutes as needed. 12/17/17 09/14/22  Revankar, Aundra Dubin, MD  prednisoLONE acetate (PRED FORTE) 1 % ophthalmic suspension Place 1 drop into the left eye 4 (four) times daily.    [provider]  propranolol (INDERAL) 20 MG tablet Take 1  tablet (20 mg total) by mouth 2 (two) times daily. May also take 1 tablet (20 mg total) as needed (PALPITATIONS). 09/18/23   Alver Sorrow, NP  simvastatin (ZOCOR) 10 MG tablet Take 10 mg by mouth at bedtime.    [provider]      Allergies    Patient has no known allergies.    Review of Systems   Review of Systems  Genitourinary:  Positive for flank pain.    Physical Exam Updated Vital Signs BP (!) 157/92 (BP Location: Right Arm)   Pulse 72   Temp 98 F (36.7 C) (Oral)   Resp 18   Ht 6' (1.829 m)   Wt 96.6 kg   SpO2 98%   BMI  28.89 kg/m  Physical Exam Vitals and nursing note reviewed.  Constitutional:      General: He is not in acute distress.    Appearance: Normal appearance. He is well-developed.  HENT:     Head: Normocephalic and atraumatic.     Right Ear: External ear normal.     Left Ear: External ear normal.     Nose: Nose normal.  Eyes:     Conjunctiva/sclera: Conjunctivae normal.  Cardiovascular:     Rate and Rhythm: Normal rate and regular rhythm.     Pulses: Normal pulses.     Heart sounds: Normal heart sounds. No murmur heard. Pulmonary:     Effort: Pulmonary effort is normal. No respiratory distress.     Breath sounds: Normal breath sounds.  Abdominal:     Palpations: Abdomen is soft.     Tenderness: There is no abdominal tenderness. There is right CVA tenderness.  Musculoskeletal:        General: No swelling. Normal range of motion.     Cervical back: Neck supple.  Skin:    General: Skin is warm and dry.     Capillary Refill: Capillary refill takes less than 2 seconds.  Neurological:     General: No focal deficit present.     Mental Status: He is alert.     Motor: No weakness.  Psychiatric:        Mood and Affect: Mood normal.     ED Results / Procedures / Treatments   Labs (all labs ordered are listed, but only abnormal results are displayed) Labs Reviewed  URINALYSIS, ROUTINE W REFLEX MICROSCOPIC - Abnormal; Notable for the following components:      Result Value   Protein, ur 30 (*)    All other components within normal limits  BASIC METABOLIC PANEL - Abnormal; Notable for the following components:   Potassium 3.3 (*)    Glucose, Bld 125 (*)    All other components within normal limits  CBC WITH DIFFERENTIAL/PLATELET  HEPATIC FUNCTION PANEL  LIPASE, BLOOD    EKG None  Radiology CT ABDOMEN PELVIS W CONTRAST Result Date: 11/15/2023 CLINICAL DATA:  Right flank pain for 3 months. EXAM: CT ABDOMEN AND PELVIS WITH CONTRAST TECHNIQUE: Multidetector CT imaging of the  abdomen and pelvis was performed using the standard protocol following bolus administration of intravenous contrast. RADIATION DOSE REDUCTION: This exam was performed according to the departmental dose-optimization program which includes automated exposure control, adjustment of the mA and/or kV according to patient size and/or use of iterative reconstruction technique. CONTRAST:  OMNIPAQUE IOHEXOL 300 MG/ML  SOLN COMPARISON:  September 23, 2023. FINDINGS: Lower chest: No acute abnormality. Hepatobiliary: No focal liver abnormality is seen. No gallstones, gallbladder wall thickening, or biliary  dilatation. Pancreas: Unremarkable. No pancreatic ductal dilatation or surrounding inflammatory changes. Spleen: Normal in size without focal abnormality. Adrenals/Urinary Tract: Adrenal glands appear normal. Small nonobstructive right renal calculus. No hydronephrosis or renal obstruction is noted. Urinary bladder is unremarkable. 17 x 13 mm peripheral low density is noted in midpole of right kidney as noted on prior exam. Stomach/Bowel: Stomach is within normal limits. Appendix appears normal. No evidence of bowel wall thickening, distention, or inflammatory changes. Vascular/Lymphatic: No significant vascular findings are present. No enlarged abdominal or pelvic lymph nodes. Reproductive: Prostate is unremarkable. Other: No abdominal wall hernia or abnormality. No abdominopelvic ascites. Musculoskeletal: No acute or significant osseous findings. IMPRESSION: 17 x 13 mm peripheral low density noted in midpole cortex of right kidney as described on prior exam. MRI with and without gadolinium according to renal protocol is recommended to rule out neoplasm. Small nonobstructive right renal calculus. Electronically Signed   By: Lupita Raider M.D.   On: 11/15/2023 15:41    Procedures Procedures    Medications Ordered in ED Medications  lidocaine (LIDODERM) 5 % 1 patch (1 patch Transdermal Patch Applied 11/15/23  1437)  iohexol (OMNIPAQUE) 300 MG/ML solution 100 mL (100 mLs Intravenous Contrast Given 11/15/23 1441)    ED Course/ Medical Decision Making/ A&P                                 Medical Decision Making Amount and/or Complexity of Data Reviewed Labs: ordered.  Risk Prescription drug management.   This patient presents to the ED with chief complaint(s) of right flank pain with pertinent past medical history of right kidney mass which further complicates the presenting complaint. The complaint involves an extensive differential diagnosis and also carries with it a high risk of complications and morbidity.    The differential diagnosis includes choledocholithiasis, pancreatitis, hepatitis, UTI, pyelonephritis kidney stone  Additional history obtained: Records reviewed Care Everywhere/External Records  ED Course and Reassessment:   Independent labs interpretation:  The following labs were independently interpreted:  CBC: No notable findings BMP: Mild hypokalemia 3.3 Hepatic function panel: No notable finding Lipase: 37 UA: 30 protein  Independent visualization of imaging: - I independently visualized the following imaging with scope of interpretation limited to determining acute life threatening conditions related to emergency care: CT abdomen pelvis with contrast, which revealed 17 x 13 mm peripheral low-density noted in the midpole cortex of right kidney I described on prior exam, small nonobstructive right renal calculus  Consultation: - Consulted or discussed management/test interpretation w/ external professional: None  Consideration for admission or further workup: Considered for mission further however patient's vital signs, physical exam, labs, and imaging have all been reassuring.  Patient will be given short course of Celebrex and Lidoderm patches for pain as needed.  Patient should follow-up with primary care physician for further evaluation and treatment if pain  persist.        Final Clinical Impression(s) / ED Diagnoses Final diagnoses:  Flank pain    Rx / DC Orders ED Discharge Orders          Ordered    celecoxib (CELEBREX) 100 MG capsule  2 times daily        11/15/23 1606    lidocaine (LIDODERM) 5 %  Every 24 hours        11/15/23 1606              Dolphus Jenny, PA-C  11/15/23 1607    [name], MD 11/16/23 [phone]

## 2023-11-15 NOTE — Discharge Instructions (Signed)
Today you were seen for right flank pain.  Please pick up your medications and take as prescribed.  Thank you for letting us treat you today. After reviewing your labs and imaging, I feel you are safe to go home. Please follow up with your PCP in the next several days and provide them with your records from this visit. Return to the Emergency Room if pain becomes severe or symptoms worsen.

## 2023-11-15 NOTE — ED Notes (Signed)
Spoke with lab about lipase and hepatic function add-ons

## 2023-11-15 NOTE — ED Triage Notes (Signed)
Pt caox4, ambulatory c/o R flank pain that has been ongoing for approx 3 mo. Pt further reports when 9mo ago a mass was found on R kidney\, dx cancer, reports ongoing sweats, HTN,flank pain, headache. Pt expressed frustration with urologist he saw this morning stating he went for his appt and that the urologist told him "the pain isn't from the mass, so you need to go to the ER for further evaluation."

## 2023-11-23 ENCOUNTER — Ambulatory Visit (HOSPITAL_BASED_OUTPATIENT_CLINIC_OR_DEPARTMENT_OTHER): Payer: BC Managed Care – PPO | Admitting: Family

## 2023-11-29 ENCOUNTER — Other Ambulatory Visit: Payer: Self-pay | Admitting: Urology

## 2023-11-29 ENCOUNTER — Telehealth: Payer: Self-pay | Admitting: *Deleted

## 2023-11-29 ENCOUNTER — Telehealth: Payer: Self-pay | Admitting: Cardiology

## 2023-11-29 NOTE — Telephone Encounter (Signed)
   Pre-operative Risk Assessment    Patient Name: Christian Aguirre  DOB: August 27, 1961 MRN: [id number]      Request for Surgical Clearance    Procedure:   Right Robotic Assisted Laparoscopic Partial Nephrectomy  Date of Surgery:  Clearance 12/13/23                                 Surgeon: Dr. Everardo All Group or Practice Name: [facility] Phone number: [phone] ext [phone] Fax number: [phone]   Type of Clearance Requested:   - Medical  - Pharmacy:  Hold Aspirin 5 days   Type of Anesthesia:  General    Additional requests/questions:      Barbette Reichmann   11/29/2023, 1:02 PM

## 2023-11-29 NOTE — Telephone Encounter (Signed)
   Name: Christian Aguirre  DOB: Feb 23, 1961  MRN: [id number]  Primary Cardiologist: Jodelle Red, MD   Preoperative team, please contact this patient and set up a phone call appointment for further preoperative risk assessment. Please obtain consent and complete medication review. Thank you for your help.  I confirm that guidance regarding antiplatelet and oral anticoagulation therapy has been completed and, if necessary, noted below.  His aspirin may be held for 5 to 7 days prior to his procedure.  Please resume as soon as hemostasis is achieved.  I also confirmed the patient resides in the state of [place]. As per [facility] Medical Board telemedicine laws, the patient must reside in the state in which the provider is licensed.   Ronney Asters, NP 11/29/2023, 1:24 PM [facility]

## 2023-11-29 NOTE — Telephone Encounter (Signed)
Spoke with patient and scheduled him for a pre op telehealth visit on 12/11/23. Meds reviewed, consent in. Will route back to requesting surgeons office to make them aware.

## 2023-11-29 NOTE — Telephone Encounter (Signed)
  Patient Consent for Virtual Visit        Christian Aguirre has provided verbal consent on 11/29/2023 for a virtual visit (video or telephone).   CONSENT FOR VIRTUAL VISIT FOR:  Christian Aguirre  By participating in this virtual visit I agree to the following:  I hereby voluntarily request, consent and authorize [facility] and its employed or contracted physicians, physician assistants, nurse practitioners or other licensed health care professionals (the Practitioner), to provide me with telemedicine health care services (the "Services") as deemed necessary by the treating Practitioner. I acknowledge and consent to receive the Services by the Practitioner via telemedicine. I understand that the telemedicine visit will involve communicating with the Practitioner through live audiovisual communication technology and the disclosure of certain medical information by electronic transmission. I acknowledge that I have been given the opportunity to request an in-person assessment or other available alternative prior to the telemedicine visit and am voluntarily participating in the telemedicine visit.  I understand that I have the right to withhold or withdraw my consent to the use of telemedicine in the course of my care at any time, without affecting my right to future care or treatment, and that the Practitioner or I may terminate the telemedicine visit at any time. I understand that I have the right to inspect all information obtained and/or recorded in the course of the telemedicine visit and may receive copies of available information for a reasonable fee.  I understand that some of the potential risks of receiving the Services via telemedicine include:  Delay or interruption in medical evaluation due to technological equipment failure or disruption; Information transmitted may not be sufficient (e.g. poor resolution of images) to allow for appropriate medical decision making by the  Practitioner; and/or  In rare instances, security protocols could fail, causing a breach of personal health information.  Furthermore, I acknowledge that it is my responsibility to provide information about my medical history, conditions and care that is complete and accurate to the best of my ability. I acknowledge that Practitioner's advice, recommendations, and/or decision may be based on factors not within their control, such as incomplete or inaccurate data provided by me or distortions of diagnostic images or specimens that may result from electronic transmissions. I understand that the practice of medicine is not an exact science and that Practitioner makes no warranties or guarantees regarding treatment outcomes. I acknowledge that a copy of this consent can be made available to me via my patient portal [facility] MyChart), or I can request a printed copy by calling the office of [facility].    I understand that my insurance will be billed for this visit.   I have read or had this consent read to me. I understand the contents of this consent, which adequately explains the benefits and risks of the Services being provided via telemedicine.  I have been provided ample opportunity to ask questions regarding this consent and the Services and have had my questions answered to my satisfaction. I give my informed consent for the services to be provided through the use of telemedicine in my medical care

## 2023-12-05 NOTE — Progress Notes (Addendum)
 PCP - Murray Amos, MD ARNETTA 11-20-23 epic Cardiologist - preop tele visit 12-11-23 Josefa Beauvais, NP  Dr. Lonni, Shelda  PPM/ICD -  Device Orders -  Rep Notified -   Chest x-ray - CTA chest 09-23-23 EKG - 09-25-23 EPIC Stress Test -  ECHO - 2019 epic Cardiac Cath -  CT coronary 09-29-22 epic BMP-11-26-23 epic, CBC/DIFF 11-15-23 epic  Sleep Study -  CPAP -   Fasting Blood Sugar - 100's Checks Blood Sugar __1___ times a day  Blood Thinner Instructions: Aspirin  Instructions: 81 mg asa hold 5-7 days  ERAS Protcol - PRE-SURGERY    COVID vaccine -yes  Activity--Able to climb a flight of stairs without CP or SOB jogs 2 miles everyday Anesthesia review: non obs CAD, HTN, DM 2,  cardiac preop tele visit 12-11-23, pt. Diastolic over 100 at preop [facility] PA-C aware pt. Advised to keep BP log at home and discuss with cardiology at preop tele visit. Pt. VU. Also advised pt. To bring BP log DOS.  Patient denies shortness of breath, fever, cough and chest pain at PAT appointment   All instructions explained to the patient, with a verbal understanding of the material. Patient agrees to go over the instructions while at home for a better understanding. Patient also instructed to self quarantine after being tested for COVID-19. The opportunity to ask questions was provided.

## 2023-12-05 NOTE — Patient Instructions (Addendum)
 SURGICAL WAITING ROOM VISITATION  Patients having surgery or a procedure may have no more than 2 support people in the waiting area - these visitors may rotate.    Children under the age of 56 must have an adult with them who is not the patient.  Due to an increase in RSV and influenza rates and associated hospitalizations, children ages 60 and under may not visit patients in [facility] hospitals.  Visitors with respiratory illnesses are discouraged from visiting and should remain at home.  If the patient needs to stay at the hospital during part of their recovery, the visitor guidelines for inpatient rooms apply. Pre-op nurse will coordinate an appropriate time for 1 support person to accompany patient in pre-op.  This support person may not rotate.    Please refer to the [facility] website for the visitor guidelines for Inpatients (after your surgery is over and you are in a regular room).       Your procedure is scheduled on: 12-13-23    Report to [facility] Main Entrance    Report to admitting at      0900  AM   Call this number if you have problems the morning of surgery [phone]   FOLLOW A CLEAR LIQUID DIET THE DAY BEFORE YOUR SURGERY  nOTHING AFTER mIDNIGHT:After Midnight. EXCEPT FOR SIPS OF WATER  WITH MEDS     CLEAR LIQUID DIET   Foods Allowed  Water                                                                 Coffee and tea, regular and decaf     no creamer or milk                                                          Fruit ices (not with fruit pulp)                     Carbonated beverages, regular and diet                                    ,white grape and apple juices   NO red Sports drinks like Gatorade  NO red Lightly seasoned clear broth or consume(fat free)   _____________________________________________________________________             If you have questions, please contact your surgeon's office.   FOLLOW BOWEL PREP AND ANY  ADDITIONAL PRE OP INSTRUCTIONS YOU RECEIVED FROM YOUR SURGEON'S OFFICE!!!              ONE 8OZ BOTTLE OF MAGNESIUM CITRATE BY NOON THE DAY BEFORE SURGERY   Oral Hygiene is also important to reduce your risk of infection.                                    Remember - BRUSH YOUR TEETH THE MORNING OF SURGERY WITH YOUR REGULAR TOOTHPASTE  DENTURES WILL BE REMOVED PRIOR TO SURGERY PLEASE DO NOT APPLY Poly grip OR ADHESIVES!!!   Do NOT smoke after Midnight   Stop all vitamins and herbal supplements 7 days before surgery.   Take these medicines the morning of surgery with A SIP OF WATER : eye drops as usual, zoloft , propranolol , doxazosin , atorvastatin ,     amlodipine ,    xanax  and tylenol  if needed  DO NOT TAKE ANY ORAL DIABETIC MEDICATIONS DAY OF YOUR SURGERY  Bring CPAP mask and tubing day of surgery.                              You may not have any metal on your body including hair pins, jewelry, and body piercing             Do not wear  lotions, powders, perfumes/cologne, or deodorant               Men may shave face and neck.   Do not bring valuables to the hospital. [facility] IS NOT             RESPONSIBLE   FOR VALUABLES.   Contacts, glasses, dentures or bridgework may not be worn into surgery.   Bring small overnight bag day of surgery.   DO NOT BRING YOUR HOME MEDICATIONS TO THE HOSPITAL. PHARMACY WILL DISPENSE MEDICATIONS LISTED ON YOUR MEDICATION LIST TO YOU DURING YOUR ADMISSION IN THE HOSPITAL!    Patients discharged on the day of surgery will not be allowed to drive home.  Someone NEEDS to stay with you for the first 24 hours after anesthesia.   Special Instructions: Bring a copy of your healthcare power of attorney and living will documents the day of surgery if you haven't scanned them before.              Please read over the following fact sheets you were given: IF YOU HAVE QUESTIONS ABOUT YOUR PRE-OP INSTRUCTIONS PLEASE CALL [phone]    If you test  positive for Covid or have been in contact with anyone that has tested positive in the last 10 days please notify you surgeon.    [facility] - Preparing for Surgery Before surgery, you can play an important role.  Because skin is not sterile, your skin needs to be as free of germs as possible.  You can reduce the number of germs on your skin by washing with CHG (chlorahexidine gluconate) soap before surgery.  CHG is an antiseptic cleaner which kills germs and bonds with the skin to continue killing germs even after washing. Please DO NOT use if you have an allergy to CHG or antibacterial soaps.  If your skin becomes reddened/irritated stop using the CHG and inform your nurse when you arrive at Short Stay. Do not shave (including legs and underarms) for at least 48 hours prior to the first CHG shower.  You may shave your face/neck. Please follow these instructions carefully:  1.  Shower with CHG Soap the night before surgery and the  morning of Surgery.  2.  If you choose to wash your hair, wash your hair first as usual with your  normal  shampoo.  3.  After you shampoo, rinse your hair and body thoroughly to remove the  shampoo.                           4.  Use CHG as you would any other liquid soap.  You can apply chg directly  to the skin and wash                       Gently with a scrungie or clean washcloth.  5.  Apply the CHG Soap to your body ONLY FROM THE NECK DOWN.   Do not use on face/ open                           Wound or open sores. Avoid contact with eyes, ears mouth and genitals (private parts).                       Wash face,  Genitals (private parts) with your normal soap.             6.  Wash thoroughly, paying special attention to the area where your surgery  will be performed.  7.  Thoroughly rinse your body with warm water  from the neck down.  8.  DO NOT shower/wash with your normal soap after using and rinsing off  the CHG Soap.                9.  Pat yourself dry with a  clean towel.            10.  Wear clean pajamas.            11.  Place clean sheets on your bed the night of your first shower and do not  sleep with pets. Day of Surgery : Do not apply any lotions/deodorants the morning of surgery.  Please wear clean clothes to the hospital/surgery center.  FAILURE TO FOLLOW THESE INSTRUCTIONS MAY RESULT IN THE CANCELLATION OF YOUR SURGERY PATIENT SIGNATURE_________________________________  NURSE SIGNATURE__________________________________  ________________________________________________________________________ WHAT IS A BLOOD TRANSFUSION? Blood Transfusion Information  A transfusion is the replacement of blood or some of its parts. Blood is made up of multiple cells which provide different functions. Red blood cells carry oxygen and are used for blood loss replacement. White blood cells fight against infection. Platelets control bleeding. Plasma helps clot blood. Other blood products are available for specialized needs, such as hemophilia or other clotting disorders. BEFORE THE TRANSFUSION  Who gives blood for transfusions?  Healthy volunteers who are fully evaluated to make sure their blood is safe. This is blood bank blood. Transfusion therapy is the safest it has ever been in the practice of medicine. Before blood is taken from a donor, a complete history is taken to make sure that person has no history of diseases nor engages in risky social behavior (examples are intravenous drug use or sexual activity with multiple partners). The donor's travel history is screened to minimize risk of transmitting infections, such as malaria. The donated blood is tested for signs of infectious diseases, such as HIV and hepatitis. The blood is then tested to be sure it is compatible with you in order to minimize the chance of a transfusion reaction. If you or a relative donates blood, this is often done in anticipation of surgery and is not appropriate for emergency  situations. It takes many days to process the donated blood. RISKS AND COMPLICATIONS Although transfusion therapy is very safe and saves many lives, the main dangers of transfusion include:  Getting an infectious disease. Developing a transfusion reaction. This is an allergic reaction to something in the blood you  were given. Every precaution is taken to prevent this. The decision to have a blood transfusion has been considered carefully by your caregiver before blood is given. Blood is not given unless the benefits outweigh the risks. AFTER THE TRANSFUSION Right after receiving a blood transfusion, you will usually feel much better and more energetic. This is especially true if your red blood cells have gotten low (anemic). The transfusion raises the level of the red blood cells which carry oxygen, and this usually causes an energy increase. The nurse administering the transfusion will monitor you carefully for complications. HOME CARE INSTRUCTIONS  No special instructions are needed after a transfusion. You may find your energy is better. Speak with your caregiver about any limitations on activity for underlying diseases you may have. SEEK MEDICAL CARE IF:  Your condition is not improving after your transfusion. You develop redness or irritation at the intravenous (IV) site. SEEK IMMEDIATE MEDICAL CARE IF:  Any of the following symptoms occur over the next 12 hours: Shaking chills. You have a temperature by mouth above 102 F (38.9 C), not controlled by medicine. Chest, back, or muscle pain. People around you feel you are not acting correctly or are confused. Shortness of breath or difficulty breathing. Dizziness and fainting. You get a rash or develop hives. You have a decrease in urine output. Your urine turns a dark color or changes to pink, red, or brown. Any of the following symptoms occur over the next 10 days: You have a temperature by mouth above 102 F (38.9 C), not controlled  by medicine. Shortness of breath. Weakness after normal activity. The white part of the eye turns yellow (jaundice). You have a decrease in the amount of urine or are urinating less often. Your urine turns a dark color or changes to pink, red, or brown. Document Released: 10/13/2000 Document Revised: 01/08/2012 Document Reviewed: 06/01/2008 [facility] Patient Information 2014 [place], [place].  _______________________________________________________________________

## 2023-12-07 ENCOUNTER — Encounter (HOSPITAL_COMMUNITY): Payer: Self-pay

## 2023-12-07 ENCOUNTER — Encounter (HOSPITAL_COMMUNITY)
Admission: RE | Admit: 2023-12-07 | Discharge: 2023-12-07 | Disposition: A | Discharge status: Home / Self Care | Payer: BC Managed Care – PPO | Source: Ambulatory Visit | Attending: Urology | Admitting: Urology

## 2023-12-07 ENCOUNTER — Other Ambulatory Visit: Payer: Self-pay

## 2023-12-07 VITALS — HR 81 | Temp 98.8°F | Resp 16 | Ht 72.0 in | Wt 210.0 lb

## 2023-12-07 DIAGNOSIS — E119 Type 2 diabetes mellitus without complications: Secondary | ICD-10-CM | POA: Diagnosis not present

## 2023-12-07 DIAGNOSIS — Z01812 Encounter for preprocedural laboratory examination: Secondary | ICD-10-CM | POA: Insufficient documentation

## 2023-12-07 HISTORY — DX: Anxiety disorder, unspecified: F41.9

## 2023-12-07 LAB — HEMOGLOBIN A1C
Hgb A1c MFr Bld: 6.1 % — ABNORMAL HIGH (ref 4.8–5.6)
Mean Plasma Glucose: 128.37 mg/dL

## 2023-12-07 LAB — GLUCOSE, CAPILLARY: Glucose-Capillary: 107 mg/dL — ABNORMAL HIGH (ref 70–99)

## 2023-12-11 ENCOUNTER — Ambulatory Visit: Payer: BC Managed Care – PPO | Attending: Nurse Practitioner | Admitting: Nurse Practitioner

## 2023-12-11 ENCOUNTER — Encounter: Payer: Self-pay | Admitting: Nurse Practitioner

## 2023-12-11 DIAGNOSIS — Z01810 Encounter for preprocedural cardiovascular examination: Secondary | ICD-10-CM | POA: Diagnosis not present

## 2023-12-11 NOTE — Progress Notes (Signed)
Virtual Visit via Telephone Note   Because of Christian Aguirre co-morbid illnesses, he is at least at moderate risk for complications without adequate follow up.  This format is felt to be most appropriate for this patient at this time.  The patient did not have access to video technology/had technical difficulties with video requiring transitioning to audio format only (telephone).  All issues noted in this document were discussed and addressed.  No physical exam could be performed with this format.  Please refer to the patient's chart for his consent to telehealth for [facility].  Evaluation Performed:  Preoperative cardiovascular risk assessment _____________   Date:  12/11/2023   Patient ID:  Christian Aguirre, DOB 11/03/1960, MRN [id number] Patient Location:  Home Provider location:   Office  Primary Care Provider:  Andreas Blower., MD Primary Cardiologist:  Jodelle Red, MD  Chief Complaint / Patient Profile   63 y.o. y/o male with a h/o type 2 diabetes, HTN rheumatic fever, elevated coronary calcium score of 7 with nonobstructive CAD, aortic calcification typical chest pain, and palpitations with cardiac monitor completed 11/14/2023 no concerning arrhythmias who is pending right robotic assisted laparoscopic partial nephrectomy on 12/13/2023 with Dr. Laverle Patter and presents today for telephonic preoperative cardiovascular risk assessment.  History of Present Illness    Christian Aguirre is a 63 y.o. male who presents via audio/video conferencing for a telehealth visit today.  Pt was last seen in cardiology clinic on 09/14/23 by Dr. Cristal Deer.  At that time RIDWAN BONDY was doing well.  The patient is now pending procedure as outlined above. Since his last visit, he denies chest pain, shortness of breath, lower extremity edema, fatigue, palpitations, melena, hematuria, hemoptysis, diaphoresis, weakness, presyncope, syncope, orthopnea, and PND. He remains active  with regular jogging and general house maintenance and is able to achieve > 4 METS activity without concerning cardiac symptoms.   Past Medical History    Past Medical History:  Diagnosis Date   Anxiety    Diabetes mellitus without complication (HCC)    ETOH abuse    Hypertension    Past Surgical History:  Procedure Laterality Date   EYE SURGERY Left    metal removed from Left eye   05-2023    Allergies  No Known Allergies  Home Medications    Prior to Admission medications   Medication Sig Start Date End Date Taking? Authorizing Provider  acetaminophen (TYLENOL) 500 MG tablet Take 1,000 mg by mouth every 6 (six) hours as needed for moderate pain (pain score 4-6).    [provider]  ALPRAZolam Prudy Feeler) 0.5 MG tablet Take 0.5 tablets (0.25 mg total) by mouth at bedtime as needed for anxiety. Patient taking differently: Take 0.5 mg by mouth 2 (two) times daily as needed for anxiety. 12/17/17   Revankar, Aundra Dubin, MD  amLODipine (NORVASC) 10 MG tablet Take 10 mg by mouth daily.    [provider]  aspirin EC 81 MG tablet Take 1 tablet (81 mg total) by mouth daily. Swallow whole. 10/10/22   Jodelle Red, MD  atorvastatin (LIPITOR) 40 MG tablet Take 40 mg by mouth daily.    [provider]  CONTOUR TEST test strip 1 each by Other route 2 (two) times daily.    [provider]  cyclobenzaprine (FLEXERIL) 10 MG tablet Take 10 mg by mouth at bedtime as needed for muscle spasms. 11/20/23   [provider]  Difluprednate 0.05 % EMUL Place 1 drop  into the left eye 4 (four) times daily. 11/13/23   [provider]  doxazosin (CARDURA) 1 MG tablet Take 1 mg by mouth daily.    [provider]  lidocaine (LIDODERM) 5 % Place 1 patch onto the skin daily. Remove & Discard patch within 12 hours or as directed by MD Patient not taking: Reported on 12/04/2023 11/15/23   Dolphus Jenny, PA-C  lisinopril (PRINIVIL,ZESTRIL) 20 MG tablet  Take 20 mg by mouth daily.    [provider]  losartan (COZAAR) 100 MG tablet Take 100 mg by mouth daily.    [provider]  metFORMIN (GLUCOPHAGE) 1000 MG tablet Take 1,000 mg by mouth daily with breakfast.    [provider]  neomycin-polymyxin b-dexamethasone (MAXITROL) 3.5-10000-0.1 OINT Place 1 Application into the left eye 4 (four) times daily as needed (dry eyes). 11/13/23   [provider]  nitroGLYCERIN (NITROSTAT) 0.4 MG SL tablet Place 1 tablet (0.4 mg total) under the tongue every 5 (five) minutes as needed. 12/17/17 12/04/23  Revankar, Aundra Dubin, MD  propranolol (INDERAL) 20 MG tablet Take 1 tablet (20 mg total) by mouth 2 (two) times daily. May also take 1 tablet (20 mg total) as needed (PALPITATIONS). 09/18/23   Alver Sorrow, NP  sertraline (ZOLOFT) 100 MG tablet Take 1 tablet by mouth daily. 11/01/23 10/31/24  [provider]  Timolol Maleate PF 0.5 % SOLN Place 1 drop into the left eye 2 (two) times daily. 10/30/23   [provider]    Physical Exam    Vital Signs:  AKON REINOSO does not have vital signs available for review today.  Given telephonic nature of communication, physical exam is limited. AAOx3. NAD. Normal affect.  Speech and respirations are unlabored.  Accessory Clinical Findings    None  Assessment & Plan    1.  Preoperative Cardiovascular Risk Assessment: According to the Revised Cardiac Risk Index (RCRI), his Perioperative Risk of Major Cardiac Event is (%): 0.9. His Functional Capacity in METs is: 8.23 according to the [facility] Activity Status Index (DASI). The patient is doing well from a cardiac perspective. Therefore, based on ACC/AHA guidelines, the patient would be at acceptable risk for the planned procedure without further cardiovascular testing.   The patient was advised that if he develops new symptoms prior to surgery to contact our office to arrange for a follow-up visit, and he verbalized  understanding.  Per office protocol, he may hold aspirin for 5-7 days prior to procedure and should resume as soon as hemodynamically stable postoperatively.  A copy of this note will be routed to requesting surgeon.  Time:   Today, I have spent 10 minutes with the patient with telehealth technology discussing medical history, symptoms, and management plan.     Levi Aland, NP-C  12/11/2023, 9:41 AM [address], [address] Office [phone] Fax [phone]

## 2023-12-12 NOTE — H&P (Signed)
 Office Visit Report     11/26/2023   --------------------------------------------------------------------------------   Christian Aguirre  MRN: [id number]  DOB: 1961-05-11, 63 year old Male  SSN:    PRIMARY CARE:  Diana Eves. Luiz Iron, MD  PRIMARY CARE FAX:  [phone]  REFERRING:  Diana Eves. Luiz Iron, MD  PROVIDER:  Heloise Purpura, M.D.  LOCATION:  [facility] [phone]     --------------------------------------------------------------------------------   CC/HPI: Right renal mass   Christian Aguirre is a 63 year old gentleman who suffered a traumatic eye injury a few months ago requiring multiple surgeries on his left eye. He then developed chest pain that resulted in an evaluation in the emergency department in November including a CT angiogram of the chest, abdomen, and pelvis. This incidentally detected a hyperdense small lesion off the right interpolar kidney. He underwent a definitive MRI of the abdomen with and without contrast on 10/04/2023 that confirmed a small 1.7 cm off the lateral aspect of the interpolar region of the right kidney that appeared to be enhancing and concerning for a possible small early renal malignancy. His chest imaging have been negative for metastatic disease. His serum creatinine 11/15/2023 was 0.94. He was seen in consultation by Dr. Sherlon Handing at [facility] and was scheduled for surgery. However, the patient did not want to wait until the end of March to proceed with surgery and was interested in an earlier date. He therefore presents today for a second opinion.   In addition, he states that he has been having other symptoms including constellation of symptoms that include bilateral back pain, diaphoresis, and palpitations. He apparently has had significant rises in his blood pressure during these events as well. He does not note that they are related to any certain position or activity. They do not seem to be related to anything that he eats or drinks.  Rather, they appear to be unprovoked. He is currently taking losartan 100 mg along with amlodipine and propranolol with improvement in his hypertension. He also is on alprazolam, atorvastatin, aspirin 81 mg, buspirone, and sertraline.   Finally, he also complains of a new symptom of urinary frequency that began last evening and has persisted this morning. He denies any other associated lower urinary tract symptoms including no dysuria, hematuria, fever, etc.     ALLERGIES: Steroid Eye Drops - Dizziness, Swelling    MEDICATIONS: Aspirin 81 mg tablet,chewable  Metformin Hcl  Alprazolam 0.5 mg tablet  Amlodipine Besilate  Brimonidine-Dorzolamide 0.15 %-2 % drops  Buspirone Hcl  Cyclobenzaprine Hcl  Difluprednate 0.05 % drops  Losartan Potassium 100 mg tablet  Propranolol Hcl  Sertraline Hcl  Timolol Maleate 0.5 % drops, once daily     GU PSH: None   NON-GU PSH: Eye Surgery Procedure, Left     GU PMH: None   NON-GU PMH: Anxiety Arrhythmia Arthritis Diabetes Type 2 Hypercholesterolemia Hypertension    FAMILY HISTORY: 2 sons - Runs in Family Diabetes - Mother Prostate Cancer - Father   SOCIAL HISTORY: Marital Status: Single Preferred Language: English; Ethnicity: Not Hispanic Or Latino; Race: Black or African American Current Smoking Status: Patient does not smoke anymore.   Tobacco Use Assessment Completed: Used Tobacco in last 30 days? Does drink.  Drinks 1 caffeinated drink per day.    REVIEW OF SYSTEMS:    GU Review Male:   Patient reports frequent urination, hard to postpone urination, get up at night to urinate, leakage of urine, and stream starts and stops. Patient denies burning/ pain  with urination, trouble starting your streams, and have to strain to urinate .  Gastrointestinal (Upper):   Patient reports nausea. Patient denies vomiting.  Gastrointestinal (Lower):   Patient reports diarrhea. Patient denies constipation.  Constitutional:   Patient reports  night sweats, weight loss, and fatigue. Patient denies fever.  Skin:   Patient denies skin rash/ lesion and itching.  Eyes:   Patient reports blurred vision. Patient denies double vision.  Ears/ Nose/ Throat:   Patient denies sore throat and sinus problems.  Hematologic/Lymphatic:   Patient denies swollen glands and easy bruising.  Cardiovascular:   Patient denies leg swelling and chest pains.  Respiratory:   Patient denies cough and shortness of breath.  Endocrine:   Patient denies excessive thirst.  Musculoskeletal:   Patient reports back pain. Patient denies joint pain.  Neurological:   Patient reports headaches and dizziness.   Psychologic:   Patient reports depression and anxiety.    VITAL SIGNS: None   MULTI-SYSTEM PHYSICAL EXAMINATION:    Constitutional: Well-nourished. No physical deformities. Normally developed. Good grooming.  Respiratory: No labored breathing, no use of accessory muscles. Clear bilaterally.  Cardiovascular: Normal temperature, normal extremity pulses, no swelling, no varicosities. Regular rate and rhythm.  Lymphatic: No enlargement, no tenderness of axillae, groin, neck lymph nodes.  Neurologic / Psychiatric: Oriented to time, oriented to place, oriented to person. No depression, no anxiety, no agitation.   Gastrointestinal: No mass, no tenderness, no rigidity, non obese abdomen.      Complexity of Data:  Lab Test Review:   BMP  X-Ray Review: C.T. Chest/ Abd/Pelvis: Reviewed Films.  C.T. Abdomen/Pelvis: Reviewed Films.  MRI Abdomen: Reviewed Films.     PROCEDURES:         PVR Ultrasound - 78295  Scanned Volume: 99 cc         Urinalysis - 81003 Dipstick Dipstick Cont'd  Specimen: Voided Bilirubin: Neg  Color: Yellow Ketones: Neg  Appearance: Clear Blood: Neg  Specific Gravity: 1.010 Protein: Trace  pH: 5.5 Urobilinogen: 0.2  Glucose: Neg Nitrites: Neg    Leukocyte Esterase: Neg    Notes:      ASSESSMENT:      ICD-10 Details  1 GU:   Right  renal neoplasm - D49.511   2 NON-GU:   Palpitations - R00.2    PLAN:           Orders Labs Metanephrines, Free, Fractionated, LC/MS/MS, Plasma, CMP          Schedule Return Visit/Planned Activity: Other See Visit Notes             Note: Will call to schedule surgery          Document Letter(s):  Created for Patient: Clinical Summary         Notes:   1. Right renal neoplasm: The patient was provided information regarding their renal mass including the relative risk of benign versus malignant pathology and the natural history of renal cell carcinoma and other possible malignancies of the kidney. The role of renal biopsy, laboratory testing, and imaging studies to further characterize renal masses and/or the presence of metastatic disease were explained. We discussed the role of active surveillance, surgical therapy with both radical nephrectomy and nephron-sparing surgery, and ablative therapy in the treatment of renal masses. In addition, we discussed our goals of providing an accurate diagnosis and oncologic control while maintaining optimal renal function as appropriate based on the size, location, and complexity of their renal mass as well as  their co-morbidities. We have discussed the risks of treatment in detail including but not limited to bleeding, infection, heart attack, stroke, death, venothromoboembolism, cancer recurrence, injury/damage to surrounding organs and structures, urine leak, the possibility of open surgical conversion for patients undergoing minimally invasive surgery, the risk of developing chronic kidney disease and its associated implications, and the potential risk of end stage renal disease possibly necessitating dialysis.   After discussion, he is most interested in proceeding with surgical therapy and a right robot-assisted laparoscopic partial nephrectomy. This will be scheduled. He understands that his renal mass not associated with any of his other symptoms and is  an incidental finding.   2. Episodic diaphoresis/palpitations/back pain/hypertension: He does not appear to have any adrenal masses. I will have him get a plasma free metanephrine drawn today to rule out the possibility he could have an extra adrenal pheochromocytoma. He may require further evaluation for this constellation of symptoms and I have recommended that he consider doing this under the care of his primary care physician.   3. Urinary frequency: He was reassured that he does not have a urinary tract infection, glucosuria, and appears to be emptying his bladder adequately. The symptoms likely will resolve spontaneously. He will notify me if they do not.   CC: Dr. Dennis Bast    E & M CODES: We spent 48 minutes dedicated to evaluation and management time, including face to face interaction, discussions on coordination of care, documentation, result review, and discussion with others as applicable.     * Signed by Heloise Purpura, M.D. on 11/26/23 at 4:16 PM (EST*

## 2023-12-12 NOTE — Progress Notes (Signed)
Pt called with extreme concern, using profanity regarding having high blood pressure "180/100 after taking the bowel prep ordered for surgery".  Referenced med list, consulted Dr Desmond Lope, and encouraged Pt to drink water, may take a second xanax, and call the Dr on call for Urology.  Pt had several BP meds available that he could potentially repeat with his Drs order.   Pt's conversation continued to escalate in intensity, encouraged him to use techniques to calm himself.

## 2023-12-13 ENCOUNTER — Other Ambulatory Visit: Payer: Self-pay

## 2023-12-13 ENCOUNTER — Encounter (HOSPITAL_COMMUNITY): Payer: Self-pay | Admitting: Urology

## 2023-12-13 ENCOUNTER — Ambulatory Visit (HOSPITAL_COMMUNITY): Payer: Self-pay | Admitting: Physician Assistant

## 2023-12-13 ENCOUNTER — Encounter (HOSPITAL_COMMUNITY)
Admission: RE | Disposition: A | Discharge status: Home / Self Care | Payer: Self-pay | Source: Home / Self Care | Attending: Urology

## 2023-12-13 ENCOUNTER — Ambulatory Visit (HOSPITAL_COMMUNITY): Payer: Self-pay | Admitting: Anesthesiology

## 2023-12-13 ENCOUNTER — Observation Stay (HOSPITAL_COMMUNITY)
Admission: RE | Admit: 2023-12-13 | Discharge: 2023-12-14 | Disposition: A | Discharge status: Home / Self Care | Payer: BC Managed Care – PPO | Attending: Urology | Admitting: Urology

## 2023-12-13 DIAGNOSIS — C641 Malignant neoplasm of right kidney, except renal pelvis: Principal | ICD-10-CM | POA: Insufficient documentation

## 2023-12-13 DIAGNOSIS — I10 Essential (primary) hypertension: Secondary | ICD-10-CM | POA: Diagnosis not present

## 2023-12-13 DIAGNOSIS — E119 Type 2 diabetes mellitus without complications: Secondary | ICD-10-CM | POA: Diagnosis not present

## 2023-12-13 DIAGNOSIS — Z87891 Personal history of nicotine dependence: Secondary | ICD-10-CM | POA: Insufficient documentation

## 2023-12-13 DIAGNOSIS — Z79899 Other long term (current) drug therapy: Secondary | ICD-10-CM | POA: Insufficient documentation

## 2023-12-13 DIAGNOSIS — D49511 Neoplasm of unspecified behavior of right kidney: Principal | ICD-10-CM | POA: Diagnosis present

## 2023-12-13 HISTORY — PX: ROBOTIC ASSITED PARTIAL NEPHRECTOMY: SHX6087

## 2023-12-13 LAB — BASIC METABOLIC PANEL
Anion gap: 10 (ref 5–15)
BUN: 15 mg/dL (ref 8–23)
CO2: 27 mmol/L (ref 22–32)
Calcium: 9.1 mg/dL (ref 8.9–10.3)
Chloride: 100 mmol/L (ref 98–111)
Creatinine, Ser: 1.22 mg/dL (ref 0.61–1.24)
GFR, Estimated: >60 mL/min (ref >60)
Glucose, Bld: 135 mg/dL — ABNORMAL HIGH (ref 70–99)
Potassium: 3.8 mmol/L (ref 3.5–5.1)
Sodium: 137 mmol/L (ref 135–145)

## 2023-12-13 LAB — GLUCOSE, CAPILLARY
Glucose-Capillary: 106 mg/dL — ABNORMAL HIGH (ref 70–99)
Glucose-Capillary: 111 mg/dL — ABNORMAL HIGH (ref 70–99)
Glucose-Capillary: 130 mg/dL — ABNORMAL HIGH (ref 70–99)
Glucose-Capillary: 129 mg/dL — ABNORMAL HIGH (ref 70–99)

## 2023-12-13 LAB — TYPE AND SCREEN
ABO/RH(D): A POS
Antibody Screen: NEGATIVE

## 2023-12-13 LAB — ABO/RH: ABO/RH(D): A POS

## 2023-12-13 LAB — HEMOGLOBIN AND HEMATOCRIT, BLOOD
HCT: 39.6 % (ref 39.0–52.0)
Hemoglobin: 12.4 g/dL — ABNORMAL LOW (ref 13.0–17.0)

## 2023-12-13 SURGERY — NEPHRECTOMY, PARTIAL, ROBOT-ASSISTED
Anesthesia: General | Laterality: Right

## 2023-12-13 MED ORDER — PROPRANOLOL HCL 20 MG PO TABS: 20.0000 mg | ORAL_TABLET | ORAL | Status: DC | PRN

## 2023-12-13 MED ORDER — TRAMADOL HCL 50 MG PO TABS: 50.0000 mg | ORAL_TABLET | Freq: Four times a day (QID) | ORAL | 0 refills | Status: AC | PRN

## 2023-12-13 MED ORDER — AMLODIPINE BESYLATE 10 MG PO TABS
10.0000 mg | ORAL_TABLET | Freq: Every day | ORAL | Status: DC
  Administered 2023-12-14: 10 mg via ORAL
  Filled 2023-12-13: qty 2

## 2023-12-13 MED ORDER — DOCUSATE SODIUM 100 MG PO CAPS: 100.0000 mg | ORAL_CAPSULE | Freq: Two times a day (BID) | ORAL | Status: AC

## 2023-12-13 MED ORDER — KETAMINE HCL 50 MG/5ML IJ SOSY
PREFILLED_SYRINGE | INTRAMUSCULAR | Status: AC
  Filled 2023-12-13: qty 5

## 2023-12-13 MED ORDER — LACTATED RINGERS IV SOLN: INTRAVENOUS | Status: DC | PRN

## 2023-12-13 MED ORDER — LOSARTAN POTASSIUM 50 MG PO TABS
100.0000 mg | ORAL_TABLET | Freq: Every day | ORAL | Status: DC
  Administered 2023-12-14: 100 mg via ORAL
  Filled 2023-12-13: qty 2

## 2023-12-13 MED ORDER — PHENYLEPHRINE HCL (PRESSORS) 10 MG/ML IV SOLN
INTRAVENOUS | Status: AC
  Filled 2023-12-13: qty 1

## 2023-12-13 MED ORDER — SUGAMMADEX SODIUM 200 MG/2ML IV SOLN
INTRAVENOUS | Status: DC | PRN
  Administered 2023-12-13: 200 mg via INTRAVENOUS

## 2023-12-13 MED ORDER — STERILE WATER FOR IRRIGATION IR SOLN
Status: DC | PRN
  Administered 2023-12-13: 1000 mL

## 2023-12-13 MED ORDER — BUPIVACAINE LIPOSOME 1.3 % IJ SUSP
INTRAMUSCULAR | Status: AC
  Filled 2023-12-13: qty 20

## 2023-12-13 MED ORDER — ONDANSETRON HCL 4 MG/2ML IJ SOLN
INTRAMUSCULAR | Status: DC | PRN
  Administered 2023-12-13: 4 mg via INTRAVENOUS

## 2023-12-13 MED ORDER — HYDROMORPHONE HCL 1 MG/ML IJ SOLN
0.2500 mg | INTRAMUSCULAR | Status: DC | PRN
  Administered 2023-12-13 (×2): 0.5 mg via INTRAVENOUS

## 2023-12-13 MED ORDER — ACETAMINOPHEN 10 MG/ML IV SOLN
1000.0000 mg | Freq: Four times a day (QID) | INTRAVENOUS | Status: DC
  Administered 2023-12-14 (×2): 1000 mg via INTRAVENOUS
  Filled 2023-12-13 (×2): qty 100

## 2023-12-13 MED ORDER — MIDAZOLAM HCL 5 MG/5ML IJ SOLN
INTRAMUSCULAR | Status: DC | PRN
  Administered 2023-12-13: 2 mg via INTRAVENOUS

## 2023-12-13 MED ORDER — FENTANYL CITRATE (PF) 100 MCG/2ML IJ SOLN
INTRAMUSCULAR | Status: DC | PRN
  Administered 2023-12-13: 50 ug via INTRAVENOUS
  Administered 2023-12-13: 100 ug via INTRAVENOUS
  Administered 2023-12-13: 50 ug via INTRAVENOUS

## 2023-12-13 MED ORDER — PROPOFOL 10 MG/ML IV BOLUS
INTRAVENOUS | Status: AC
  Filled 2023-12-13: qty 20

## 2023-12-13 MED ORDER — DOCUSATE SODIUM 100 MG PO CAPS
100.0000 mg | ORAL_CAPSULE | Freq: Two times a day (BID) | ORAL | Status: DC
  Administered 2023-12-13 – 2023-12-14 (×2): 100 mg via ORAL
  Filled 2023-12-13 (×2): qty 1

## 2023-12-13 MED ORDER — CYCLOBENZAPRINE HCL 10 MG PO TABS
10.0000 mg | ORAL_TABLET | Freq: Every evening | ORAL | Status: DC | PRN
  Administered 2023-12-13: 10 mg via ORAL
  Filled 2023-12-13: qty 1

## 2023-12-13 MED ORDER — CHLORHEXIDINE GLUCONATE 0.12 % MT SOLN
15.0000 mL | Freq: Once | OROMUCOSAL | Status: AC
  Administered 2023-12-13: 15 mL via OROMUCOSAL

## 2023-12-13 MED ORDER — EPHEDRINE SULFATE-NACL 50-0.9 MG/10ML-% IV SOSY
PREFILLED_SYRINGE | INTRAVENOUS | Status: DC | PRN
  Administered 2023-12-13: 5 mg via INTRAVENOUS
  Administered 2023-12-13 (×2): 7.5 mg via INTRAVENOUS
  Administered 2023-12-13: 5 mg via INTRAVENOUS

## 2023-12-13 MED ORDER — DIPHENHYDRAMINE HCL 12.5 MG/5ML PO ELIX
12.5000 mg | ORAL_SOLUTION | Freq: Four times a day (QID) | ORAL | Status: DC | PRN
  Administered 2023-12-13: 25 mg via ORAL
  Filled 2023-12-13: qty 10

## 2023-12-13 MED ORDER — HYOSCYAMINE SULFATE 0.125 MG SL SUBL: 0.1250 mg | SUBLINGUAL_TABLET | Freq: Four times a day (QID) | SUBLINGUAL | Status: DC | PRN

## 2023-12-13 MED ORDER — DIPHENHYDRAMINE HCL 50 MG/ML IJ SOLN: 12.5000 mg | Freq: Four times a day (QID) | INTRAMUSCULAR | Status: DC | PRN

## 2023-12-13 MED ORDER — BUPIVACAINE LIPOSOME 1.3 % IJ SUSP
INTRAMUSCULAR | Status: DC | PRN
  Administered 2023-12-13: 20 mL

## 2023-12-13 MED ORDER — DEXTROSE-SODIUM CHLORIDE 5-0.45 % IV SOLN: INTRAVENOUS | Status: DC

## 2023-12-13 MED ORDER — MELATONIN 3 MG PO TABS
3.0000 mg | ORAL_TABLET | Freq: Every day | ORAL | Status: DC
  Administered 2023-12-13: 3 mg via ORAL
  Filled 2023-12-13: qty 1

## 2023-12-13 MED ORDER — ROCURONIUM BROMIDE 10 MG/ML (PF) SYRINGE
PREFILLED_SYRINGE | INTRAVENOUS | Status: DC | PRN
  Administered 2023-12-13 (×2): 30 mg via INTRAVENOUS
  Administered 2023-12-13: 70 mg via INTRAVENOUS

## 2023-12-13 MED ORDER — ORAL CARE MOUTH RINSE: 15.0000 mL | Freq: Once | OROMUCOSAL | Status: AC

## 2023-12-13 MED ORDER — DOXAZOSIN MESYLATE 1 MG PO TABS
1.0000 mg | ORAL_TABLET | Freq: Every day | ORAL | Status: DC
  Administered 2023-12-14: 1 mg via ORAL
  Filled 2023-12-13: qty 1

## 2023-12-13 MED ORDER — NEOMYCIN-POLYMYXIN-DEXAMETH 3.5-10000-0.1 OP OINT: 1.0000 | TOPICAL_OINTMENT | Freq: Four times a day (QID) | OPHTHALMIC | Status: DC | PRN

## 2023-12-13 MED ORDER — SERTRALINE HCL 100 MG PO TABS
100.0000 mg | ORAL_TABLET | Freq: Every day | ORAL | Status: DC
  Administered 2023-12-14: 100 mg via ORAL
  Filled 2023-12-13: qty 1

## 2023-12-13 MED ORDER — PHENYLEPHRINE HCL (PRESSORS) 10 MG/ML IV SOLN
INTRAVENOUS | Status: DC | PRN
  Administered 2023-12-13 (×4): 80 ug via INTRAVENOUS

## 2023-12-13 MED ORDER — MORPHINE SULFATE (PF) 2 MG/ML IV SOLN
2.0000 mg | INTRAVENOUS | Status: DC | PRN
  Administered 2023-12-13 (×2): 4 mg via INTRAVENOUS
  Filled 2023-12-13 (×2): qty 2

## 2023-12-13 MED ORDER — PROPOFOL 10 MG/ML IV BOLUS
INTRAVENOUS | Status: DC | PRN
  Administered 2023-12-13: 200 mg via INTRAVENOUS

## 2023-12-13 MED ORDER — LACTATED RINGERS IV SOLN: INTRAVENOUS | Status: DC

## 2023-12-13 MED ORDER — BUPIVACAINE-EPINEPHRINE 0.25% -1:200000 IJ SOLN
INTRAMUSCULAR | Status: AC
  Filled 2023-12-13: qty 1

## 2023-12-13 MED ORDER — CEFAZOLIN SODIUM-DEXTROSE 1-4 GM/50ML-% IV SOLN
1.0000 g | Freq: Three times a day (TID) | INTRAVENOUS | Status: AC
  Administered 2023-12-13 – 2023-12-14 (×2): 1 g via INTRAVENOUS
  Filled 2023-12-13 (×2): qty 50

## 2023-12-13 MED ORDER — FENTANYL CITRATE (PF) 100 MCG/2ML IJ SOLN
INTRAMUSCULAR | Status: AC
  Filled 2023-12-13: qty 2

## 2023-12-13 MED ORDER — LIDOCAINE 2% (20 MG/ML) 5 ML SYRINGE
INTRAMUSCULAR | Status: DC | PRN
  Administered 2023-12-13: 100 mg via INTRAVENOUS

## 2023-12-13 MED ORDER — SODIUM CHLORIDE (PF) 0.9 % IJ SOLN
INTRAMUSCULAR | Status: DC | PRN
  Administered 2023-12-13: 20 mL

## 2023-12-13 MED ORDER — PHENYLEPHRINE HCL-NACL 20-0.9 MG/250ML-% IV SOLN
INTRAVENOUS | Status: DC | PRN
Start: 2023-12-13 — End: 2023-12-13
  Administered 2023-12-13: 55 ug/min via INTRAVENOUS

## 2023-12-13 MED ORDER — KETAMINE HCL 10 MG/ML IJ SOLN
INTRAMUSCULAR | Status: DC | PRN
  Administered 2023-12-13: 40 mg via INTRAVENOUS

## 2023-12-13 MED ORDER — NITROGLYCERIN 0.4 MG SL SUBL: 0.4000 mg | SUBLINGUAL_TABLET | SUBLINGUAL | Status: DC | PRN

## 2023-12-13 MED ORDER — ACETAMINOPHEN 500 MG PO TABS
1000.0000 mg | ORAL_TABLET | Freq: Once | ORAL | Status: AC
  Administered 2023-12-13: 1000 mg via ORAL
  Filled 2023-12-13: qty 2

## 2023-12-13 MED ORDER — HYDROMORPHONE HCL 1 MG/ML IJ SOLN
INTRAMUSCULAR | Status: AC
Start: 2023-12-13 — End: 2023-12-14
  Filled 2023-12-13: qty 2

## 2023-12-13 MED ORDER — LISINOPRIL 20 MG PO TABS: 20.0000 mg | ORAL_TABLET | Freq: Every day | ORAL | Status: DC

## 2023-12-13 MED ORDER — HEMOSTATIC AGENTS (NO CHARGE) OPTIME
TOPICAL | Status: DC | PRN
  Administered 2023-12-13: 1 via TOPICAL

## 2023-12-13 MED ORDER — INSULIN ASPART 100 UNIT/ML IJ SOLN
0.0000 [IU] | INTRAMUSCULAR | Status: DC
  Administered 2023-12-14: 2 [IU] via SUBCUTANEOUS

## 2023-12-13 MED ORDER — DROPERIDOL 2.5 MG/ML IJ SOLN: 0.6250 mg | Freq: Once | INTRAMUSCULAR | Status: DC | PRN

## 2023-12-13 MED ORDER — CEFAZOLIN SODIUM-DEXTROSE 2-4 GM/100ML-% IV SOLN
2.0000 g | INTRAVENOUS | Status: AC
  Administered 2023-12-13: 2 g via INTRAVENOUS
  Filled 2023-12-13: qty 100

## 2023-12-13 MED ORDER — PROPRANOLOL HCL 20 MG PO TABS
20.0000 mg | ORAL_TABLET | Freq: Two times a day (BID) | ORAL | Status: DC
  Administered 2023-12-13 – 2023-12-14 (×2): 20 mg via ORAL
  Filled 2023-12-13: qty 2
  Filled 2023-12-13: qty 1

## 2023-12-13 MED ORDER — ALPRAZOLAM 0.25 MG PO TABS
0.2500 mg | ORAL_TABLET | Freq: Every evening | ORAL | Status: DC | PRN
  Administered 2023-12-13: 0.25 mg via ORAL
  Filled 2023-12-13: qty 1

## 2023-12-13 MED ORDER — EPHEDRINE 5 MG/ML INJ
INTRAVENOUS | Status: AC
  Filled 2023-12-13: qty 5

## 2023-12-13 MED ORDER — MIDAZOLAM HCL 2 MG/2ML IJ SOLN
INTRAMUSCULAR | Status: AC
Start: 2023-12-13 — End: ?
  Filled 2023-12-13: qty 2

## 2023-12-13 MED ORDER — ONDANSETRON HCL 4 MG/2ML IJ SOLN: 4.0000 mg | INTRAMUSCULAR | Status: DC | PRN

## 2023-12-13 MED ORDER — ALBUMIN HUMAN 5 % IV SOLN: INTRAVENOUS | Status: DC | PRN

## 2023-12-13 MED ORDER — DEXAMETHASONE SODIUM PHOSPHATE 10 MG/ML IJ SOLN
INTRAMUSCULAR | Status: DC | PRN
  Administered 2023-12-13: 4 mg via INTRAVENOUS

## 2023-12-13 MED ORDER — SODIUM CHLORIDE (PF) 0.9 % IJ SOLN
INTRAMUSCULAR | Status: AC
  Filled 2023-12-13: qty 20

## 2023-12-13 MED ORDER — TIMOLOL MALEATE 0.5 % OP SOLN
1.0000 [drp] | Freq: Two times a day (BID) | OPHTHALMIC | Status: DC
  Administered 2023-12-14: 1 [drp] via OPHTHALMIC
  Filled 2023-12-13: qty 5

## 2023-12-13 MED ORDER — ATORVASTATIN CALCIUM 40 MG PO TABS
40.0000 mg | ORAL_TABLET | Freq: Every day | ORAL | Status: DC
  Administered 2023-12-14: 40 mg via ORAL
  Filled 2023-12-13: qty 1

## 2023-12-13 MED ORDER — DIFLUPREDNATE 0.05 % OP EMUL: 1.0000 [drp] | Freq: Four times a day (QID) | OPHTHALMIC | Status: DC

## 2023-12-13 MED ORDER — GLYCOPYRROLATE PF 0.2 MG/ML IJ SOSY
PREFILLED_SYRINGE | INTRAMUSCULAR | Status: DC | PRN
  Administered 2023-12-13: .2 mg via INTRAVENOUS

## 2023-12-13 SURGICAL SUPPLY — 55 items
APPLICATOR SURGIFLO ENDO (HEMOSTASIS) IMPLANT
BAG COUNTER SPONGE SURGICOUNT (BAG) IMPLANT
CHLORAPREP W/TINT 26 (MISCELLANEOUS) ×1 IMPLANT
CLIP LIGATING HEM O LOK PURPLE (MISCELLANEOUS) ×1 IMPLANT
CLIP LIGATING HEMO O LOK GREEN (MISCELLANEOUS) ×2 IMPLANT
COVER SURGICAL LIGHT HANDLE (MISCELLANEOUS) ×1 IMPLANT
COVER TIP SHEARS 8 DVNC (MISCELLANEOUS) ×1 IMPLANT
DERMABOND ADVANCED .7 DNX12 (GAUZE/BANDAGES/DRESSINGS) ×1 IMPLANT
DRAIN CHANNEL 15F RND FF 3/16 (WOUND CARE) ×1 IMPLANT
DRAPE ARM DVNC X/XI (DISPOSABLE) ×4 IMPLANT
DRAPE COLUMN DVNC XI (DISPOSABLE) ×1 IMPLANT
DRAPE INCISE IOBAN 66X45 STRL (DRAPES) ×1 IMPLANT
DRAPE SHEET LG 3/4 BI-LAMINATE (DRAPES) ×1 IMPLANT
DRIVER NDL LRG 8 DVNC XI (INSTRUMENTS) ×2 IMPLANT
DRIVER NDLE LRG 8 DVNC XI (INSTRUMENTS) ×2 IMPLANT
ELECT PENCIL ROCKER SW 15FT (MISCELLANEOUS) ×1 IMPLANT
ELECT REM PT RETURN 15FT ADLT (MISCELLANEOUS) ×1 IMPLANT
EVACUATOR SILICONE 100CC (DRAIN) ×1 IMPLANT
FORCEPS BPLR 8 MD DVNC XI (FORCEP) ×1 IMPLANT
FORCEPS PROGRASP DVNC XI (FORCEP) ×1 IMPLANT
GAUZE 4X4 16PLY ~~LOC~~+RFID DBL (SPONGE) ×1 IMPLANT
GLOVE BIO SURGEON STRL SZ 6.5 (GLOVE) ×1 IMPLANT
GLOVE SURG LX STRL 7.5 STRW (GLOVE) ×2 IMPLANT
GOWN SRG XL LVL 4 BRTHBL STRL (GOWNS) ×1 IMPLANT
GOWN STRL REUS W/ TWL XL LVL3 (GOWN DISPOSABLE) ×2 IMPLANT
HEMOSTAT SURGICEL 4X8 (HEMOSTASIS) IMPLANT
HOLDER FOLEY CATH W/STRAP (MISCELLANEOUS) ×1 IMPLANT
IRRIG SUCT STRYKERFLOW 2 WTIP (MISCELLANEOUS) ×1 IMPLANT
IRRIGATION SUCT STRKRFLW 2 WTP (MISCELLANEOUS) ×1 IMPLANT
KIT BASIN OR (CUSTOM PROCEDURE TRAY) ×1 IMPLANT
KIT TURNOVER KIT A (KITS) IMPLANT
PROTECTOR NERVE ULNAR (MISCELLANEOUS) ×2 IMPLANT
SCISSORS LAP 5X35 DISP (ENDOMECHANICALS) IMPLANT
SCISSORS MNPLR CVD DVNC XI (INSTRUMENTS) ×1 IMPLANT
SEAL UNIV 5-12 XI (MISCELLANEOUS) ×4 IMPLANT
SET TUBE SMOKE EVAC HIGH FLOW (TUBING) ×1 IMPLANT
SOL ELECTROSURG ANTI STICK (MISCELLANEOUS) ×1 IMPLANT
SOLUTION ELECTROSURG ANTI STCK (MISCELLANEOUS) ×1 IMPLANT
SPIKE FLUID TRANSFER (MISCELLANEOUS) ×1 IMPLANT
SURGIFLO W/THROMBIN 8M KIT (HEMOSTASIS) ×1 IMPLANT
SUT ETHILON 3 0 PS 1 (SUTURE) ×1 IMPLANT
SUT MNCRL AB 4-0 PS2 18 (SUTURE) ×2 IMPLANT
SUT PDS PLUS AB 0 CT-2 (SUTURE) ×2 IMPLANT
SUT V-LOC BARB 180 2/0GR6 GS22 (SUTURE) ×1 IMPLANT
SUT VIC AB 0 CT1 27XBRD ANTBC (SUTURE) ×1 IMPLANT
SUT VLOC BARB 180 ABS3/0GR12 (SUTURE) ×1 IMPLANT
SUTURE V-LC BRB 180 2/0GR6GS22 (SUTURE) ×1 IMPLANT
SUTURE VLOC BRB 180 ABS3/0GR12 (SUTURE) ×1 IMPLANT
SYS BAG RETRIEVAL 10MM (BASKET) ×1 IMPLANT
SYSTEM BAG RETRIEVAL 10MM (BASKET) ×1 IMPLANT
TOWEL OR 17X26 10 PK STRL BLUE (TOWEL DISPOSABLE) ×1 IMPLANT
TRAY FOLEY MTR SLVR 16FR STAT (SET/KITS/TRAYS/PACK) ×1 IMPLANT
TRAY LAPAROSCOPIC (CUSTOM PROCEDURE TRAY) ×1 IMPLANT
TROCAR Z THREAD OPTICAL 12X100 (TROCAR) ×1 IMPLANT
WATER STERILE IRR 1000ML POUR (IV SOLUTION) ×1 IMPLANT

## 2023-12-13 NOTE — Anesthesia Procedure Notes (Signed)
Procedure Name: Intubation Date/Time: 12/13/2023 11:01 AM  Performed by: Ponciano Ort, CRNAPre-anesthesia Checklist: Patient identified, Emergency Drugs available, Suction available and Patient being monitored Patient Re-evaluated:Patient Re-evaluated prior to induction Oxygen Delivery Method: Circle system utilized Preoxygenation: Pre-oxygenation with 100% oxygen Induction Type: IV induction Ventilation: Mask ventilation without difficulty and Oral airway inserted - appropriate to patient size Laryngoscope Size: Mac and 4 Grade View: Grade I Tube type: Oral Tube size: 7.5 mm Number of attempts: 1 Airway Equipment and Method: Stylet and Oral airway Placement Confirmation: ETT inserted through vocal cords under direct vision, positive ETCO2 and breath sounds checked- equal and bilateral Secured at: 22 cm Tube secured with: Tape Dental Injury: Teeth and Oropharynx as per pre-operative assessment

## 2023-12-13 NOTE — Plan of Care (Signed)
Problem: Education: Goal: Knowledge of General Education information will improve Description Including pain rating scale, medication(s)/side effects and non-pharmacologic comfort measures Outcome: Progressing   Problem: Clinical Measurements: Goal: Ability to maintain clinical measurements within normal limits will improve Outcome: Progressing Goal: Will remain free from infection Outcome: Progressing Goal: Respiratory complications will improve Outcome: Progressing Goal: Cardiovascular complication will be avoided Outcome: Progressing   Problem: Nutrition: Goal: Adequate nutrition will be maintained Outcome: Progressing   Problem: Coping: Goal: Level of anxiety will decrease Outcome: Progressing

## 2023-12-13 NOTE — Discharge Instructions (Signed)

## 2023-12-13 NOTE — Progress Notes (Signed)
Pt refused D5 1/2 NS. Dr Laverle Patter paged for notification.

## 2023-12-13 NOTE — Transfer of Care (Signed)
Immediate Anesthesia Transfer of Care Note  Patient: DAYMEN HASSEBROCK  Procedure(s) Performed: XI RIGHT ROBOTIC ASSISTED LAPAROSCOPIC  PARTIAL NEPHRECTOMY (Right)  Patient Location: PACU  Anesthesia Type:General  Level of Consciousness: awake, alert , oriented, and patient cooperative  Airway & Oxygen Therapy: Patient Spontanous Breathing and Patient connected to face mask oxygen  Post-op Assessment: Report given to RN and Post -op Vital signs reviewed and stable  Post vital signs: Reviewed and stable  Last Vitals:  Vitals Value Taken Time  BP 142/81 12/13/23 1407  Temp    Pulse 66 12/13/23 1409  Resp 9 12/13/23 1411  SpO2 98 % 12/13/23 1409  Vitals shown include unfiled device data.  Last Pain:  Vitals:   12/13/23 0837  TempSrc: Oral  PainSc: 0-No pain      Patients Stated Pain Goal: 4 (12/13/23 0837)  Complications: No notable events documented.

## 2023-12-13 NOTE — Anesthesia Postprocedure Evaluation (Signed)
Anesthesia Post Note  Patient: Christian Aguirre  Procedure(s) Performed: XI RIGHT ROBOTIC ASSISTED LAPAROSCOPIC  PARTIAL NEPHRECTOMY (Right)     Patient location during evaluation: PACU Anesthesia Type: General Level of consciousness: awake and alert Pain management: pain level controlled Vital Signs Assessment: post-procedure vital signs reviewed and stable Respiratory status: spontaneous breathing, nonlabored ventilation and respiratory function stable Cardiovascular status: blood pressure returned to baseline Postop Assessment: no apparent nausea or vomiting Anesthetic complications: no   No notable events documented.  Last Vitals:  Vitals:   12/13/23 1530 12/13/23 1545  BP: 134/86 134/86  Pulse: 76   Resp: 14   Temp:    SpO2: 93% 95%    Last Pain:  Vitals:   12/13/23 1545  TempSrc:   PainSc: 4                  Shanda Howells

## 2023-12-13 NOTE — Interval H&P Note (Signed)
History and Physical Interval Note:  12/13/2023 9:53 AM  Christian Aguirre  has presented today for surgery, with the diagnosis of RIGHT RENAL NEOPLASM.  The various methods of treatment have been discussed with the patient and family. After consideration of risks, benefits and other options for treatment, the patient has consented to  Procedure(s) with comments: XI RIGHT ROBOTIC ASSISTED LAPAROSCOPIC  PARTIAL NEPHRECTOMY (Right) - 210 MINUTES NEEDED FOR CASE as a surgical intervention.  The patient's history has been reviewed, patient examined, no change in status, stable for surgery.  I have reviewed the patient's chart and labs.  Questions were answered to the patient's satisfaction.     [employer]

## 2023-12-13 NOTE — Progress Notes (Signed)
Post-op note  Subjective: The patient is doing well.  No complaints. Pain controlled. Resting comfortably.   Objective: Vital signs in last 24 hours: Temp:  [97.2 F (36.2 C)-98.7 F (37.1 C)] 97.2 F (36.2 C) (02/13 1408) Pulse Rate:  [66-84] 80 (02/13 1612) Resp:  [9-20] 13 (02/13 1612) BP: (133-167)/(81-103) 139/86 (02/13 1600) SpO2:  [93 %-100 %] 97 % (02/13 1612) Weight:  [95.3 kg] 95.3 kg (02/13 0837)  Intake/Output from previous day: No intake/output data recorded. Intake/Output this shift: Total I/O In: 2700 [I.V.:2200; IV Piggyback:500] Out: 150 [Urine:100; Blood:50]  Physical Exam:  General: Alert and oriented. Abdomen: Soft, Nondistended. Incisions: Clean and dry.  Lab Results: Recent Labs    12/13/23 1429  HGB 12.4*  HCT 39.6    Assessment/Plan: POD#0   1) Continue to monitor  [name], MD Resident Physician [facility]   LOS: 0 days   Zettie Pho 12/13/2023, 4:16 PM

## 2023-12-13 NOTE — Op Note (Signed)
Preoperative diagnosis: Right renal neoplasm  Postoperative diagnosis: Right renal neoplasm  Procedure:  Right robotic-assisted laparoscopic partial nephrectomy Intraoperative renal ultrasonography  Surgeon: Moody Bruins. M.D.  Assistant(s): Harrie Foreman, PA-C  An assistant was required for this surgical procedure.  The duties of the assistant included but were not limited to suctioning, passing suture, camera manipulation, retraction. This procedure would not be able to be performed without an Geophysicist/field seismologist.  Resident: Dr. Michaelene Song  Anesthesia: General  Complications: None  EBL: 50 mL  IVF:  2200 mL crystalloid, 500 mL colloid  Specimens: Right renal neoplasm  Disposition of specimens: Pathology  Intraoperative findings:       1. Warm renal ischemia time: 9 minutes       2. Intraoperative renal ultrasound findings: There was a solid appearing 1.5 cm right renal mass off the lateral interpolar mass.  Drains: # 15 Blake perinephric drain  Indication:  Christian Aguirre is a 63 y.o. year old patient with a right renal mass.  After a thorough review of the management options for their renal mass, they elected to proceed with surgical treatment and the above procedure.  We have discussed the potential benefits and risks of the procedure, side effects of the proposed treatment, the likelihood of the patient achieving the goals of the procedure, and any potential problems that might occur during the procedure or recuperation. Informed consent has been obtained.   Description of procedure:  The patient was taken to the operating room and a general anesthetic was administered. The patient was given preoperative antibiotics, placed in the right modified flank position with care to pad all potential pressure points, and prepped and draped in the usual sterile fashion. Next a preoperative timeout was performed.  A site was selected on in the midline for placement of the  assistant port. This was placed using a standard open Hassan technique which allowed entry into the peritoneal cavity under direct vision and without difficulty. A 12 mm port was placed and a pneumoperitoneum established. The camera was then used to inspect the abdomen and there was no evidence of any intra-abdominal injuries or other abnormalities. The remaining abdominal ports were then placed. 8 mm robotic ports were placed in the right upper quadrant, right lower quadrant, and far right lateral abdominal wall. An 8 mm port was placed for the camera site just right of the umbilicus. All ports were placed under direct vision without difficulty. The surgical cart was then docked.   Utilizing the cautery scissors, the white line of Toldt was incised allowing the colon to be mobilized medially and the plane between the mesocolon and the anterior layer of Gerota's fascia to be developed and the kidney to be exposed.  The ureter and gonadal vein were identified inferiorly and the ureter was lifted anteriorly off the psoas muscle.  Dissection proceeded superiorly along the gonadal vein until the renal vein was identified.  The renal hilum was then carefully isolated with a combination of blunt and sharp dissection allowing the renal arterial and venous structures to be separated and isolated in preparation for renal hilar vessel clamping. There were 3 renal veins and a single renal artery.    Attention turned to the kidney and the perinephric fat surrounding the renal mass was removed and the kidney was mobilized sufficiently for exposure and resection of the renal mass.   Intraoperative renal ultrasonography was utilized with the laparoscopic ultrasound probe to identify the renal tumor and identify the tumor margins.  Once the renal mass was properly isolated, preparations were made for resection of the tumor.  Reconstructive sutures were placed into the abdomen for the renorrhaphy portion of the procedure.   The renal artery was then clamped with bulldog clamps.  The tumor was then excised with cold scissor dissection along with an adequate visible gross margin of normal renal parenchyma. The tumor appeared to be excised without any gross violation of the tumor. The renal collecting system was not entered during removal of the tumor.  A running 3-0 V-lock suture was then brought through the capsule of the kidney and run along the base of the renal defect to provide hemostasis and close any entry into the renal collecting system if present. Weck clips were used to secure this suture outside the renal capsule at the proximal and distal ends. An additional hemostatic agent (Surgiflo) was then placed into the renal defect. A running 2-0 V lock suture was then used to close the capsule of the kidney using a sliding clip technique which resulted in excellent hemostasis.    The bulldog clamps were then removed from the renal hilar vessel(s). Total warm renal ischemia time was 9 minutes. The renal tumor resection site was examined. Hemostasis appeared adequate.   The kidney was placed back into its normal anatomic position and covered with perinephric fat as needed.  A # 15 Blake drain was then brought through the lateral lower port site and positioned in the perinephric space.  It was secured to the skin with a nylon suture. The surgical cart was undocked.  The renal tumor specimen was removed intact within an endopouch retrieval bag via the upper midline port site. This incision site was closed at the fascial layer with 0-vicryl suture. All other laparoscopic/robotic ports were removed under direct vision and the pneumoperitoneum let down with inspection of the operative field performed and hemostasis again confirmed. All incision sites were then injected with local anesthetic and reapproximated at the skin level with 4-0 monocryl subcuticular closures.  Dermabond was applied to the skin.  The patient tolerated the  procedure well and without complications.  The patient was able to be extubated and transferred to the recovery unit in satisfactory condition.  Moody Bruins MD

## 2023-12-13 NOTE — Anesthesia Preprocedure Evaluation (Addendum)
Anesthesia Evaluation  Patient identified by MRN, date of birth, ID band Patient awake    Reviewed: Allergy & Precautions, NPO status , Patient's Chart, lab work & pertinent test results, reviewed documented beta blocker date and time   History of Anesthesia Complications Negative for: history of anesthetic complications  Airway Mallampati: II  TM Distance: >3 FB Neck ROM: Full    Dental  (+) Missing,    Pulmonary former smoker   Pulmonary exam normal        Cardiovascular hypertension, Pt. on medications and Pt. on home beta blockers Normal cardiovascular exam     Neuro/Psych   Anxiety     negative neurological ROS     GI/Hepatic negative GI ROS,,,(+)     substance abuse  alcohol use  Endo/Other  diabetes, Type 2, Oral Hypoglycemic Agents    Renal/GU Right renal ca  negative genitourinary   Musculoskeletal negative musculoskeletal ROS (+)    Abdominal   Peds  Hematology negative hematology ROS (+)   Anesthesia Other Findings Day of surgery medications reviewed with patient.  Reproductive/Obstetrics negative OB ROS                             Anesthesia Physical Anesthesia Plan  ASA: 3  Anesthesia Plan: General   Post-op Pain Management: Tylenol PO (pre-op)* and Ketamine IV*   Induction: Intravenous  PONV Risk Score and Plan: 3 and Midazolam, Dexamethasone, Ondansetron and Treatment may vary due to age or medical condition  Airway Management Planned: Oral ETT  Additional Equipment: None  Intra-op Plan:   Post-operative Plan: Extubation in OR  Informed Consent: I have reviewed the patients History and Physical, chart, labs and discussed the procedure including the risks, benefits and alternatives for the proposed anesthesia with the patient or authorized representative who has indicated his/her understanding and acceptance.     Dental advisory given  Plan Discussed  with: CRNA  Anesthesia Plan Comments:        Anesthesia Quick Evaluation

## 2023-12-14 ENCOUNTER — Encounter (HOSPITAL_COMMUNITY): Payer: Self-pay | Admitting: Urology

## 2023-12-14 DIAGNOSIS — C641 Malignant neoplasm of right kidney, except renal pelvis: Secondary | ICD-10-CM | POA: Diagnosis not present

## 2023-12-14 LAB — GLUCOSE, CAPILLARY
Glucose-Capillary: 107 mg/dL — ABNORMAL HIGH (ref 70–99)
Glucose-Capillary: 112 mg/dL — ABNORMAL HIGH (ref 70–99)
Glucose-Capillary: 127 mg/dL — ABNORMAL HIGH (ref 70–99)
Glucose-Capillary: 128 mg/dL — ABNORMAL HIGH (ref 70–99)

## 2023-12-14 LAB — BASIC METABOLIC PANEL
Anion gap: 13 (ref 5–15)
BUN: 13 mg/dL (ref 8–23)
CO2: 27 mmol/L (ref 22–32)
Calcium: 8.9 mg/dL (ref 8.9–10.3)
Chloride: 98 mmol/L (ref 98–111)
Creatinine, Ser: 1.18 mg/dL (ref 0.61–1.24)
GFR, Estimated: >60 mL/min (ref >60)
Glucose, Bld: 115 mg/dL — ABNORMAL HIGH (ref 70–99)
Potassium: 3.3 mmol/L — ABNORMAL LOW (ref 3.5–5.1)
Sodium: 138 mmol/L (ref 135–145)

## 2023-12-14 LAB — HEMOGLOBIN AND HEMATOCRIT, BLOOD
HCT: 38.2 % — ABNORMAL LOW (ref 39.0–52.0)
Hemoglobin: 12.4 g/dL — ABNORMAL LOW (ref 13.0–17.0)

## 2023-12-14 LAB — CREATININE, FLUID (PLEURAL, PERITONEAL, JP DRAINAGE): Creat, Fluid: 1 mg/dL

## 2023-12-14 LAB — SURGICAL PATHOLOGY

## 2023-12-14 MED ORDER — BISACODYL 10 MG RE SUPP
10.0000 mg | Freq: Once | RECTAL | Status: AC
  Administered 2023-12-14: 10 mg via RECTAL
  Filled 2023-12-14: qty 1

## 2023-12-14 MED ORDER — ACETAMINOPHEN 500 MG PO TABS
1000.0000 mg | ORAL_TABLET | Freq: Four times a day (QID) | ORAL | Status: DC
  Administered 2023-12-14: 1000 mg via ORAL
  Filled 2023-12-14: qty 2

## 2023-12-14 MED ORDER — TRAMADOL HCL 50 MG PO TABS
50.0000 mg | ORAL_TABLET | Freq: Four times a day (QID) | ORAL | Status: DC | PRN
  Administered 2023-12-14: 100 mg via ORAL
  Filled 2023-12-14: qty 2

## 2023-12-14 NOTE — Progress Notes (Signed)
Changed dressing due to being saturated with blood. MD at bedside and saw/evaluated.

## 2023-12-14 NOTE — Plan of Care (Signed)
Problem: Education: Goal: Knowledge of General Education information will improve Description Including pain rating scale, medication(s)/side effects and non-pharmacologic comfort measures Outcome: Progressing

## 2023-12-14 NOTE — Discharge Summary (Signed)
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 Briefly, Christian Aguirre is a 63 y.o. year old patient who was admitted with right renal mass .   Hospital Course: Pt admitted and underwent robotic right partial nephrectomy on 12/13/2023. Their hospital course was unremarkable. By POD1, they were tolerating a regular diet, voiding spontaneously, pain was controlled with oral medications, and they were deemed appropriate for discharge. JP creatinine was negative, and removed prior to pt leaving hospital.   Their course was complicated by: None  On the day of discharge, the patient was tolerating a regular diet and their pain was well controlled. They were determined to be stable for discharge home  Physical Exam:  Neuro: AAOx4 Resp: Non-labored breathing on RA Abd: Appropriately tender, soft, non-distended. Incisions c/d/I GU: voiding spontaneously  Laboratory values:  Recent Labs    12/13/23 1429 12/14/23 0535  HGB 12.4* 12.4*  HCT 39.6 38.2*   Recent Labs    12/13/23 1429 12/14/23 0535  CREATININE 1.22 1.18    Disposition: Home  Discharge medications:  Allergies as of 12/14/2023   No Known Allergies      Medication List     STOP taking these  medications    aspirin EC 81 MG tablet       TAKE these medications    acetaminophen 500 MG tablet Commonly known as: TYLENOL Take 1,000 mg by mouth every 6 (six) hours as needed for moderate pain (pain score 4-6).   ALPRAZolam 0.5 MG tablet Commonly known as: XANAX Take 0.5 tablets (0.25 mg total) by mouth at bedtime as needed for anxiety. What changed:  how much to take when to take this   amLODipine 10 MG tablet Commonly known as: NORVASC Take 10 mg by mouth daily.   atorvastatin 40 MG tablet Commonly known as: LIPITOR Take 40 mg by mouth daily.   Contour Test test strip Generic drug: glucose blood 1 each by Other route 2 (two) times daily.   cyclobenzaprine 10 MG tablet Commonly known as: FLEXERIL Take 10 mg by mouth at bedtime as needed for muscle spasms.   Difluprednate 0.05 % Emul Place 1 drop into the left eye 4 (four) times daily.   docusate sodium 100 MG capsule Commonly known as: COLACE Take 1 capsule (100 mg total) by mouth 2 (two) times daily.   doxazosin 1 MG tablet Commonly known as: CARDURA Take 1 mg by mouth daily.   lidocaine 5 % Commonly known as: Lidoderm Place 1 patch onto the skin daily. Remove & Discard patch within 12 hours or as directed by MD   lisinopril 20 MG tablet Commonly known as: ZESTRIL Take 20 mg by mouth daily.   losartan 100 MG tablet Commonly known as: COZAAR Take  100 mg by mouth daily.   metFORMIN 1000 MG tablet Commonly known as: GLUCOPHAGE Take 1,000 mg by mouth daily with breakfast.   neomycin-polymyxin b-dexamethasone 3.5-10000-0.1 Oint Commonly known as: MAXITROL Place 1 Application into the left eye 4 (four) times daily as needed (dry eyes).   nitroGLYCERIN 0.4 MG SL tablet Commonly known as: NITROSTAT Place 1 tablet (0.4 mg total) under the tongue every 5 (five) minutes as needed.   propranolol 20 MG tablet Commonly known as: INDERAL Take 1 tablet (20 mg total) by mouth 2 (two) times daily. May also  take 1 tablet (20 mg total) as needed (PALPITATIONS).   sertraline 100 MG tablet Commonly known as: ZOLOFT Take 1 tablet by mouth daily.   Timolol Maleate PF 0.5 % Soln Place 1 drop into the left eye 2 (two) times daily.   traMADol 50 MG tablet Commonly known as: Ultram Take 1-2 tablets (50-100 mg total) by mouth every 6 (six) hours as needed for moderate pain (pain score 4-6) or severe pain (pain score 7-10).        Followup:   Follow-up Information     Heloise Purpura, MD Follow up on 01/01/2024.   Specialty: Urology Why: at 10:45 Contact information: [address] [place] [address] [phone]

## 2023-12-14 NOTE — Progress Notes (Signed)
1 Day Post-Op  Subjective: The patient is doing well.  No nausea or vomiting. Pain controlled with addition of tylenol overnight.   Objective: Vital signs in last 24 hours: Temp:  [97.2 F (36.2 C)-98.7 F (37.1 C)] 97.9 F (36.6 C) (02/14 0403) Pulse Rate:  [66-90] 74 (02/14 0403) Resp:  [9-20] 15 (02/14 0403) BP: (133-167)/(81-103) 137/90 (02/14 0403) SpO2:  [93 %-100 %] 100 % (02/14 0403) Weight:  [95.3 kg-96.1 kg] 96.1 kg (02/13 1649)  Intake/Output from previous day: 02/13 0701 - 02/14 0700 In: 4010.6 [P.O.:580; I.V.:2930.6; IV Piggyback:500] Out: 2150 [Urine:2000; Drains:100; Blood:50] Intake/Output this shift: Total I/O In: 730.6 [I.V.:730.6] Out: 1000 [Urine:900; Drains:100]  Physical Exam:  General: Alert and oriented. CV: RRR Lungs: Clear bilaterally. GI: Soft, Nondistended. Incisions: Clean, dry, and intact Urine: Clear, foley in place  Extremities: Nontender, no erythema, no edema.  Lab Results: Recent Labs    12/13/23 1429 12/14/23 0535  HGB 12.4* 12.4*  HCT 39.6 38.2*      Assessment/Plan: POD# 1 s/p robotic right partial nephrectomy.  1) SL IVF 2) Ambulate, Incentive spirometry 3) Transition to oral pain medication 4) JP creatinine with drain removal if negative 5) TOV with foley removal 6) Plan for likely discharge later today  [name], MD Resident Physician [facility]    LOS: 0 days   Christian Aguirre 12/14/2023, 6:45 AM

## 2023-12-14 NOTE — Progress Notes (Signed)
   12/14/23 0904  TOC Brief Assessment  Insurance and Status Reviewed  Patient has primary care physician Yes  Home environment has been reviewed Resides in townhome with significant other  Prior level of function: Independent with ADLs at baseline  Prior/Current Home Services No current home services  Social Drivers of Health Review SDOH reviewed no interventions necessary  Readmission risk has been reviewed Yes  Transition of care needs no transition of care needs at this time

## 2024-01-03 ENCOUNTER — Encounter (HOSPITAL_BASED_OUTPATIENT_CLINIC_OR_DEPARTMENT_OTHER): Payer: Self-pay

## 2024-01-03 ENCOUNTER — Other Ambulatory Visit: Payer: Self-pay

## 2024-01-03 ENCOUNTER — Emergency Department (HOSPITAL_BASED_OUTPATIENT_CLINIC_OR_DEPARTMENT_OTHER)

## 2024-01-03 ENCOUNTER — Emergency Department (HOSPITAL_BASED_OUTPATIENT_CLINIC_OR_DEPARTMENT_OTHER)
Admission: EM | Admit: 2024-01-03 | Discharge: 2024-01-03 | Discharge status: Against Medical Advice | Source: Home / Self Care

## 2024-01-03 ENCOUNTER — Emergency Department (HOSPITAL_BASED_OUTPATIENT_CLINIC_OR_DEPARTMENT_OTHER)
Admission: EM | Admit: 2024-01-03 | Discharge: 2024-01-03 | Disposition: A | Discharge status: Home / Self Care | Attending: Emergency Medicine | Admitting: Emergency Medicine

## 2024-01-03 DIAGNOSIS — Z85528 Personal history of other malignant neoplasm of kidney: Secondary | ICD-10-CM | POA: Insufficient documentation

## 2024-01-03 DIAGNOSIS — Z79899 Other long term (current) drug therapy: Secondary | ICD-10-CM | POA: Insufficient documentation

## 2024-01-03 DIAGNOSIS — Z7984 Long term (current) use of oral hypoglycemic drugs: Secondary | ICD-10-CM | POA: Diagnosis not present

## 2024-01-03 DIAGNOSIS — R519 Headache, unspecified: Secondary | ICD-10-CM | POA: Diagnosis present

## 2024-01-03 DIAGNOSIS — E119 Type 2 diabetes mellitus without complications: Secondary | ICD-10-CM | POA: Insufficient documentation

## 2024-01-03 DIAGNOSIS — I10 Essential (primary) hypertension: Secondary | ICD-10-CM | POA: Diagnosis not present

## 2024-01-03 DIAGNOSIS — R109 Unspecified abdominal pain: Secondary | ICD-10-CM | POA: Diagnosis not present

## 2024-01-03 MED ORDER — METOCLOPRAMIDE HCL 10 MG PO TABS
10.0000 mg | ORAL_TABLET | Freq: Once | ORAL | Status: AC
  Administered 2024-01-03: 10 mg via ORAL
  Filled 2024-01-03: qty 1

## 2024-01-03 MED ORDER — KETOROLAC TROMETHAMINE 30 MG/ML IJ SOLN
30.0000 mg | Freq: Once | INTRAMUSCULAR | Status: AC
  Administered 2024-01-03: 30 mg via INTRAMUSCULAR
  Filled 2024-01-03: qty 1

## 2024-01-03 MED ORDER — DIPHENHYDRAMINE HCL 25 MG PO CAPS
25.0000 mg | ORAL_CAPSULE | Freq: Once | ORAL | Status: AC
  Administered 2024-01-03: 25 mg via ORAL
  Filled 2024-01-03: qty 1

## 2024-01-03 MED ORDER — DIPHENHYDRAMINE HCL 50 MG/ML IJ SOLN: 25.0000 mg | Freq: Once | INTRAMUSCULAR | Status: DC

## 2024-01-03 MED ORDER — KETOROLAC TROMETHAMINE 15 MG/ML IJ SOLN: 15.0000 mg | Freq: Once | INTRAMUSCULAR | Status: DC

## 2024-01-03 MED ORDER — METOCLOPRAMIDE HCL 5 MG/ML IJ SOLN: 10.0000 mg | Freq: Once | INTRAMUSCULAR | Status: DC

## 2024-01-03 NOTE — Discharge Instructions (Signed)
 As discussed, the CT scan of your head as well as cervical spine appeared normal.  Continue to follow-up with primary care regarding your headache.  You may need a neurologist for reassessment if these continue to occur.  Will attach their number to call.  Please do not hesitate to return if the worrisome signs and symptoms we discussed to become apparent.

## 2024-01-03 NOTE — ED Provider Notes (Signed)
 [place] [facility] AT [facility] Provider Note   CSN: [id number] Arrival date & time: 01/03/24  1558     History  Chief Complaint  Patient presents with   Headache    Christian Aguirre is a 63 y.o. male.   Headache   63 year old male presents emergency department with complaints of headache.  Patient reports having headache intermittently over the past couple of months.  Describes headache as bandlike sensation around the head and also experienced use pain/needlelike sensation in the same distribution.  Reports some radiation down his neck.  Also reports right-sided flank pain that occurs "at random times" that is been happening for the past couple of months but makes the headache worse.  Denies any visual streams, gait abnormality, slurred speech, facial droop, weakness/sensory deficits in upper extremities.  Denies any abdominal pain, nausea, vomiting fever, urinary symptoms, change in bowel habits.  Past medical history significant for diabetes mellitus, ethanol abuse, hypertension, anxiety, neoplasm of right kidney with partial nephrectomy 1/25  Home Medications Prior to Admission medications   Medication Sig Start Date End Date Taking? Authorizing Provider  acetaminophen (TYLENOL) 500 MG tablet Take 1,000 mg by mouth every 6 (six) hours as needed for moderate pain (pain score 4-6).    [provider]  ALPRAZolam Prudy Feeler) 0.5 MG tablet Take 0.5 tablets (0.25 mg total) by mouth at bedtime as needed for anxiety. Patient taking differently: Take 0.5 mg by mouth 2 (two) times daily as needed for anxiety. 12/17/17   Revankar, Aundra Dubin, MD  amLODipine (NORVASC) 10 MG tablet Take 10 mg by mouth daily.    [provider]  atorvastatin (LIPITOR) 40 MG tablet Take 40 mg by mouth daily.    [provider]  CONTOUR TEST test strip 1 each by Other route 2 (two) times daily.    [provider]  cyclobenzaprine (FLEXERIL) 10 MG tablet Take  10 mg by mouth at bedtime as needed for muscle spasms. 11/20/23   [provider]  Difluprednate 0.05 % EMUL Place 1 drop into the left eye 4 (four) times daily. 11/13/23   [provider]  docusate sodium (COLACE) 100 MG capsule Take 1 capsule (100 mg total) by mouth 2 (two) times daily. 12/13/23   Harrie Foreman, PA-C  doxazosin (CARDURA) 1 MG tablet Take 1 mg by mouth daily.    [provider]  lidocaine (LIDODERM) 5 % Place 1 patch onto the skin daily. Remove & Discard patch within 12 hours or as directed by MD Patient not taking: Reported on 12/04/2023 11/15/23   Dolphus Jenny, PA-C  lisinopril (PRINIVIL,ZESTRIL) 20 MG tablet Take 20 mg by mouth daily.    [provider]  losartan (COZAAR) 100 MG tablet Take 100 mg by mouth daily.    [provider]  metFORMIN (GLUCOPHAGE) 1000 MG tablet Take 1,000 mg by mouth daily with breakfast.    [provider]  neomycin-polymyxin b-dexamethasone (MAXITROL) 3.5-10000-0.1 OINT Place 1 Application into the left eye 4 (four) times daily as needed (dry eyes). 11/13/23   [provider]  nitroGLYCERIN (NITROSTAT) 0.4 MG SL tablet Place 1 tablet (0.4 mg total) under the tongue every 5 (five) minutes as needed. 12/17/17 12/04/23  Revankar, Aundra Dubin, MD  propranolol (INDERAL) 20 MG tablet Take 1 tablet (20 mg total) by mouth 2 (two) times daily. May also take 1 tablet (20 mg total) as needed (PALPITATIONS). 09/18/23   Alver Sorrow, NP  sertraline (ZOLOFT) 100  MG tablet Take 1 tablet by mouth daily. 11/01/23 10/31/24  [provider]  Timolol Maleate PF 0.5 % SOLN Place 1 drop into the left eye 2 (two) times daily. 10/30/23   [provider]  traMADol (ULTRAM) 50 MG tablet Take 1-2 tablets (50-100 mg total) by mouth every 6 (six) hours as needed for moderate pain (pain score 4-6) or severe pain (pain score 7-10). 12/13/23   Harrie Foreman, PA-C      Allergies    Patient has no known allergies.     Review of Systems   Review of Systems  Neurological:  Positive for headaches.  All other systems reviewed and are negative.   Physical Exam Updated Vital Signs BP (!) 142/90 (BP Location: Right Arm)   Pulse 80   Temp 98.7 F (37.1 C) (Oral)   Resp 20   Ht 6' (1.829 m)   Wt 97.5 kg   SpO2 98%   BMI 29.16 kg/m  Physical Exam Vitals and nursing note reviewed.  Constitutional:      General: He is not in acute distress.    Appearance: He is well-developed.  HENT:     Head: Normocephalic and atraumatic.  Eyes:     Conjunctiva/sclera: Conjunctivae normal.  Cardiovascular:     Rate and Rhythm: Normal rate and regular rhythm.     Heart sounds: No murmur heard. Pulmonary:     Effort: Pulmonary effort is normal. No respiratory distress.     Breath sounds: Normal breath sounds. No wheezing, rhonchi or rales.  Abdominal:     Palpations: Abdomen is soft.     Tenderness: There is no abdominal tenderness.  Musculoskeletal:        General: No swelling.     Cervical back: Normal range of motion and neck supple. No rigidity or tenderness.  Skin:    General: Skin is warm and dry.     Capillary Refill: Capillary refill takes less than 2 seconds.  Neurological:     Mental Status: He is alert.     Comments: Alert and oriented to self, place, time and event.   Speech is fluent, clear without dysarthria or dysphasia.   Strength 5/5 in upper/lower extremities   Sensation intact in upper/lower extremities   Normal gait.  CN I not tested  CN II not tested CN III, IV, VI PERRLA and EOMs intact bilaterally  CN V Intact sensation to sharp and light touch to the face  CN VII facial movements symmetric  CN VIII not tested  CN IX, X no uvula deviation, symmetric rise of soft palate  CN XI 5/5 SCM and trapezius strength bilaterally  CN XII Midline tongue protrusion, symmetric L/R movements     Psychiatric:        Mood and Affect: Mood normal.    ED Results / Procedures /  Treatments   Labs (all labs ordered are listed, but only abnormal results are displayed) Labs Reviewed - No data to display  EKG None  Radiology No results found.  Procedures Procedures    Medications Ordered in ED Medications - No data to display  ED Course/ Medical Decision Making/ A&P                                 Medical Decision Making Amount and/or Complexity of Data Reviewed Radiology: ordered.  Risk Prescription drug management.   This patient presents to the ED for concern  of headache, this involves an extensive number of treatment options, and is a complaint that carries with it a high risk of complications and morbidity.  The differential diagnosis includes migraine, tension, cluster headache, malignancy, carotid artery/vertebral artery dissection, meningitis/encephalitis, cerebral thrombosis, other   Co morbidities that complicate the patient evaluation  See HPI   Additional history obtained:  Additional history obtained from EMR External records from outside source obtained and reviewed including hospital records   Lab Tests:  N/a   Imaging Studies ordered:  I ordered imaging studies including CT head/cervical spine I independently visualized and interpreted imaging which showed no acute intracranial abnormality.  No acute fracture or traumatic subluxation of cervical spine. I agree with the radiologist interpretation   Cardiac Monitoring: / EKG:  The patient was maintained on a cardiac monitor.  I personally viewed and interpreted the cardiac monitored which showed an underlying rhythm of: Sinus rhythm   Consultations Obtained:  N/a   Problem List / ED Course / Critical interventions / Medication management  Headache I ordered medication including Toradol, Reglan, Benadryl   Reevaluation of the patient after these medicines showed that the patient improved I have reviewed the patients home medicines and have made adjustments as  needed   Social Determinants of Health:  Polysubstance use.   Test / Admission - Considered:  Headache Vitals signs significant for hypertension blood pressure 142/90. Otherwise within normal range and stable throughout visit Imaging studies significant for: See above 63 year old male presents emergency department with complaints of headache.  Patient reports having headache intermittently over the past couple of months.  Describes headache as bandlike sensation around the head and also experienced use pain/needlelike sensation in the same distribution.  Reports some radiation down his neck.  Also reports right-sided flank pain that occurs "at random times" that is been happening for the past couple of months but makes the headache worse.  Denies any visual streams, gait abnormality, slurred speech, facial droop, weakness/sensory deficits in upper extremities.  Denies any abdominal pain, nausea, vomiting fever, urinary symptoms, change in bowel habits. On exam, patient with nonfocal neuroexam.  No evidence clinically of meningismus.  Initially described headache as a band type sensation around head.  CT imaging of both head and cervical spine obtained by triage staff were negative for any acute abnormality.  Patient treated with migraine cocktail and did note improvement of symptoms.  Suspect tension headache as etiology of symptoms.  Unsure of exact etiology of patient's right-sided flank pain that tends to worsen headache.  Offered imaging studies of patient's abdomen especially given recent right-sided nephrectomy but patient has been having similar pain for the past 2 months independent of surgical intervention; patient deferred imaging studies.  Will recommend follow-up with PCP/neurology in outpatient setting for reassessment of symptoms.  Treatment plan discussed with patient and he acknowledged understanding was agreeable to said plan.  Patient will well-appearing, afebrile in no acute  distress. Worrisome signs and symptoms were discussed with the patient, and the patient acknowledged understanding to return to the ED if noticed. Patient was stable upon discharge.          Final Clinical Impression(s) / ED Diagnoses Final diagnoses:  None    Rx / DC Orders ED Discharge Orders     None         Peter Garter, PA 01/03/24 1915    Virgina Norfolk, DO 01/03/24 2206

## 2024-01-03 NOTE — ED Triage Notes (Addendum)
 Patient arrives POV with complaints of back pain radiating up to his head, causing headaches x1 month. Patient is concerned due to the pain going up to a 10/10.  Concerned about the pain being a "neuro" issue due to being a diagnosed with "spinal headaches".

## 2024-01-31 ENCOUNTER — Other Ambulatory Visit: Payer: Self-pay

## 2024-01-31 ENCOUNTER — Encounter (HOSPITAL_BASED_OUTPATIENT_CLINIC_OR_DEPARTMENT_OTHER): Payer: Self-pay | Admitting: Emergency Medicine

## 2024-01-31 ENCOUNTER — Emergency Department (HOSPITAL_BASED_OUTPATIENT_CLINIC_OR_DEPARTMENT_OTHER)
Admission: EM | Admit: 2024-01-31 | Discharge: 2024-01-31 | Disposition: A | Discharge status: Home / Self Care | Attending: Emergency Medicine | Admitting: Emergency Medicine

## 2024-01-31 DIAGNOSIS — R519 Headache, unspecified: Secondary | ICD-10-CM | POA: Diagnosis present

## 2024-01-31 DIAGNOSIS — G44209 Tension-type headache, unspecified, not intractable: Secondary | ICD-10-CM | POA: Diagnosis not present

## 2024-01-31 DIAGNOSIS — Z7984 Long term (current) use of oral hypoglycemic drugs: Secondary | ICD-10-CM | POA: Diagnosis not present

## 2024-01-31 DIAGNOSIS — I10 Essential (primary) hypertension: Secondary | ICD-10-CM | POA: Insufficient documentation

## 2024-01-31 DIAGNOSIS — E119 Type 2 diabetes mellitus without complications: Secondary | ICD-10-CM | POA: Insufficient documentation

## 2024-01-31 DIAGNOSIS — Z79899 Other long term (current) drug therapy: Secondary | ICD-10-CM | POA: Insufficient documentation

## 2024-01-31 NOTE — ED Provider Notes (Signed)
 [place] EMERGENCY DEPARTMENT AT [facility] Provider Note   CSN: [id number] Arrival date & time: 01/31/24  0865     History Chief Complaint  Patient presents with   Headache    Christian Aguirre is a 63 y.o. male patient with history of hypertension and diabetes who presents to the emergency department today for further evaluation of a headache.  Patient has been having these headaches intermittently for quite some time.  Chart review does reveal that the patient was seen here a a month ago for similar symptoms.  He had CT scans of the cervical spine and neck which were both negative.  He currently does have an MRI scheduled per the patient.  He denies any focal weakness or numbness.  He describes the headache as a bandlike distribution around his head.  He is also complaining of neck pain and trapezius pain.  Denies any recent illness, fever, chills, chest pain, shortness of breath.   Headache      Home Medications Prior to Admission medications   Medication Sig Start Date End Date Taking? Authorizing Provider  acetaminophen (TYLENOL) 500 MG tablet Take 1,000 mg by mouth every 6 (six) hours as needed for moderate pain (pain score 4-6).    [provider]  ALPRAZolam Prudy Feeler) 0.5 MG tablet Take 0.5 tablets (0.25 mg total) by mouth at bedtime as needed for anxiety. Patient taking differently: Take 0.5 mg by mouth 2 (two) times daily as needed for anxiety. 12/17/17   Revankar, Aundra Dubin, MD  amLODipine (NORVASC) 10 MG tablet Take 10 mg by mouth daily.    [provider]  atorvastatin (LIPITOR) 40 MG tablet Take 40 mg by mouth daily.    [provider]  CONTOUR TEST test strip 1 each by Other route 2 (two) times daily.    [provider]  cyclobenzaprine (FLEXERIL) 10 MG tablet Take 10 mg by mouth at bedtime as needed for muscle spasms. 11/20/23   [provider]  Difluprednate 0.05 % EMUL Place 1 drop into the left eye 4 (four) times  daily. 11/13/23   [provider]  docusate sodium (COLACE) 100 MG capsule Take 1 capsule (100 mg total) by mouth 2 (two) times daily. 12/13/23   Harrie Foreman, PA-C  doxazosin (CARDURA) 1 MG tablet Take 1 mg by mouth daily.    [provider]  lidocaine (LIDODERM) 5 % Place 1 patch onto the skin daily. Remove & Discard patch within 12 hours or as directed by MD Patient not taking: Reported on 12/04/2023 11/15/23   Dolphus Jenny, PA-C  lisinopril (PRINIVIL,ZESTRIL) 20 MG tablet Take 20 mg by mouth daily.    [provider]  losartan (COZAAR) 100 MG tablet Take 100 mg by mouth daily.    [provider]  metFORMIN (GLUCOPHAGE) 1000 MG tablet Take 1,000 mg by mouth daily with breakfast.    [provider]  neomycin-polymyxin b-dexamethasone (MAXITROL) 3.5-10000-0.1 OINT Place 1 Application into the left eye 4 (four) times daily as needed (dry eyes). 11/13/23   [provider]  nitroGLYCERIN (NITROSTAT) 0.4 MG SL tablet Place 1 tablet (0.4 mg total) under the tongue every 5 (five) minutes as needed. 12/17/17 12/04/23  Revankar, Aundra Dubin, MD  propranolol (INDERAL) 20 MG tablet Take 1 tablet (20 mg total) by mouth 2 (two) times daily. May also take 1 tablet (20 mg total) as needed (PALPITATIONS). 09/18/23   Alver Sorrow, NP  sertraline (ZOLOFT) 100 MG tablet Take 1  tablet by mouth daily. 11/01/23 10/31/24  [provider]  Timolol Maleate PF 0.5 % SOLN Place 1 drop into the left eye 2 (two) times daily. 10/30/23   [provider]  traMADol (ULTRAM) 50 MG tablet Take 1-2 tablets (50-100 mg total) by mouth every 6 (six) hours as needed for moderate pain (pain score 4-6) or severe pain (pain score 7-10). 12/13/23   Harrie Foreman, PA-C      Allergies    Patient has no known allergies.    Review of Systems   Review of Systems  Neurological:  Positive for headaches.  All other systems reviewed and are negative.   Physical Exam Updated Vital  Signs BP 136/82 (BP Location: Right Arm)   Pulse 87   Temp 98.2 F (36.8 C)   Resp 20   Ht 6' (1.829 m)   Wt 97 kg   SpO2 100%   BMI 29.00 kg/m  Physical Exam Vitals and nursing note reviewed.  Constitutional:      Appearance: Normal appearance.  HENT:     Head: Normocephalic and atraumatic.  Eyes:     General:        Right eye: No discharge.        Left eye: No discharge.     Conjunctiva/sclera: Conjunctivae normal.  Neck:     Comments: Headache was reproducible with palpation of the right trapezius while having the patient looking to the left. Pulmonary:     Effort: Pulmonary effort is normal.  Skin:    General: Skin is warm and dry.     Findings: No rash.  Neurological:     General: No focal deficit present.     Mental Status: He is alert.     Comments: Cranial nerves II through XII are intact.  5/5 strength to the upper and lower extremities.  Normal sensation to the upper and lower extremities.  No dysmetria with finger-to-nose.  Speech is normal.  Extraocular movements are intact without any evidence of nystagmus.  Psychiatric:        Mood and Affect: Mood normal.        Behavior: Behavior normal.     ED Results / Procedures / Treatments   Labs (all labs ordered are listed, but only abnormal results are displayed) Labs Reviewed - No data to display  EKG None  Radiology No results found.  Procedures Procedures    Medications Ordered in ED Medications - No data to display  ED Course/ Medical Decision Making/ A&P   {   Click here for ABCD2, HEART and other calculators  Medical Decision Making Christian Aguirre is a 63 y.o. male patient who presents to the emergency department today for further evaluation of headache.  I do favor tension headache at this time.  His pain was reproducible with palpation of the right trapezius.  There was palpable spasm in the right and this was worse than the left.  No focal weakness or numbness.  I would low suspicion  for stroke at this time.  Patient did have imaging roughly a month ago.  CT imaging was normal.  He does have an MRI scheduled in the near future per the patient.    I do not see any record of this.  I am going to have him treat this tension headache conservatively with massage over the paracervical musculature and trapezius muscles.  During my exam, when palpating the right trapezius musculature his headache resolved spontaneously.  Will have him follow  up with his PCP for further evaluation and have him take Ibuprofen after he speaks with his primary care doctor as he has been adjusting his antihypertensive medications.  He is safe for discharge at this time.  He was encouraged to return with any concerns.     Final Clinical Impression(s) / ED Diagnoses Final diagnoses:  Tension headache    Rx / DC Orders ED Discharge Orders     None         Jolyn Lent 01/31/24 1037    [name], MD 02/01/24 [phone]

## 2024-01-31 NOTE — ED Triage Notes (Signed)
 C/o "sharp pain shooting thru head" x 2 days. Seen for same last month. States feels like "tight band squeezing head". No focal deficits.

## 2024-01-31 NOTE — Discharge Instructions (Signed)
 Like we discussed, I do not think that this is anything serious at this time.  I would focus on massage and speak with your primary care doctor about being able to take ibuprofen on an as-needed basis for your tension headaches.  Please keep your appointment for your MRI.  You may return to the emergency department for any worsening symptoms.

## 2024-02-07 ENCOUNTER — Other Ambulatory Visit: Payer: Self-pay

## 2024-02-07 ENCOUNTER — Emergency Department (HOSPITAL_BASED_OUTPATIENT_CLINIC_OR_DEPARTMENT_OTHER)
Admission: EM | Admit: 2024-02-07 | Discharge: 2024-02-07 | Disposition: A | Discharge status: Home / Self Care | Attending: Emergency Medicine | Admitting: Emergency Medicine

## 2024-02-07 ENCOUNTER — Encounter (HOSPITAL_BASED_OUTPATIENT_CLINIC_OR_DEPARTMENT_OTHER): Payer: Self-pay | Admitting: Emergency Medicine

## 2024-02-07 DIAGNOSIS — R519 Headache, unspecified: Secondary | ICD-10-CM | POA: Diagnosis present

## 2024-02-07 DIAGNOSIS — M5481 Occipital neuralgia: Secondary | ICD-10-CM | POA: Insufficient documentation

## 2024-02-07 MED ORDER — DEXAMETHASONE SODIUM PHOSPHATE 4 MG/ML IJ SOLN
4.0000 mg | Freq: Once | INTRAMUSCULAR | Status: AC
  Administered 2024-02-07: 4 mg via INTRAVENOUS
  Filled 2024-02-07: qty 1

## 2024-02-07 MED ORDER — BACLOFEN 10 MG PO TABS: 5.0000 mg | ORAL_TABLET | Freq: Three times a day (TID) | ORAL | 0 refills | Status: AC | PRN

## 2024-02-07 MED ORDER — BUPIVACAINE HCL 0.5 % IJ SOLN
10.0000 mL | Freq: Once | INTRAMUSCULAR | Status: AC
  Administered 2024-02-07: 10 mL

## 2024-02-07 NOTE — ED Provider Notes (Signed)
 [place] EMERGENCY DEPARTMENT AT [facility] Provider Note   CSN: [id number] Arrival date & time: 02/07/24  1317     History  Chief Complaint  Patient presents with   Headache    Christian Aguirre is a 63 y.o. male who presents emergency department with a chief complaint of sharp scalp pain.  This has been ongoing for approximately 1 month.  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  He describes sharp shooting intermittent pain lasting a few seconds to few minutes at a time.  He states that when it happens it so intensely painful he can barely stand it.  He states that it starts in the back of his neck and shoots forward across his scalp and happens on both sides of his head.  He has no facial weakness or numbness.  He has no pain at this time he is states that he had it just before he came in the room to evaluate him.   Headache      Home Medications Prior to Admission medications   Medication Sig Start Date End Date Taking? Authorizing Provider  acetaminophen (TYLENOL) 500 MG tablet Take 1,000 mg by mouth every 6 (six) hours as needed for moderate pain (pain score 4-6).    [provider]  ALPRAZolam (XANAX) 0.5 MG tablet Take 0.5 tablets (0.25 mg total) by mouth at bedtime as needed for anxiety. Patient taking differently: Take 0.5 mg by mouth 2 (two) times daily as needed for anxiety. 12/17/17   Revankar, Micael Adas, MD  amLODipine (NORVASC) 10 MG tablet Take 10 mg by mouth daily.    [provider]  atorvastatin (LIPITOR) 40 MG tablet Take 40 mg by mouth daily.    [provider]  CONTOUR TEST test strip 1 each by Other route 2 (two) times daily.    [provider]  cyclobenzaprine (FLEXERIL) 10 MG tablet Take 10 mg by mouth at bedtime as needed for muscle spasms. 11/20/23   [provider]  Difluprednate 0.05 % EMUL Place 1 drop into the left eye 4 (four) times daily. 11/13/23   [provider]  docusate sodium (COLACE) 100 MG capsule Take 1 capsule (100 mg total) by mouth 2 (two) times daily. 12/13/23   Carrolyn Clan, PA-C  doxazosin (CARDURA) 1 MG tablet Take 1 mg by mouth daily.    [provider]  lidocaine (LIDODERM) 5 % Place 1 patch onto the skin daily. Remove & Discard patch within 12 hours or as directed by MD Patient not taking: Reported on 12/04/2023 11/15/23   Keith, Kayla N, PA-C  lisinopril (PRINIVIL,ZESTRIL) 20 MG tablet Take 20 mg by mouth daily.    [provider]  losartan (COZAAR) 100 MG tablet Take 100 mg by mouth daily.    [provider]  metFORMIN (GLUCOPHAGE) 1000 MG tablet Take 1,000 mg by mouth daily with breakfast.    [provider]  neomycin-polymyxin b-dexamethasone (MAXITROL) 3.5-10000-0.1 OINT Place 1 Application into the left eye 4 (four) times daily as needed (dry eyes). 11/13/23   [provider]  nitroGLYCERIN (NITROSTAT) 0.4 MG SL tablet Place 1 tablet (0.4 mg total) under the tongue every 5 (five) minutes as needed. 12/17/17 12/04/23  Revankar, Micael Adas, MD  propranolol (INDERAL) 20 MG tablet Take 1 tablet (20 mg total) by mouth  2 (two) times daily. May also take 1 tablet (20 mg total) as needed (PALPITATIONS). 09/18/23   Walker, Caitlin S, NP  sertraline (ZOLOFT) 100 MG tablet Take 1 tablet by mouth daily. 11/01/23 10/31/24  [provider]  Timolol Maleate PF 0.5 % SOLN Place 1 drop into the left eye 2 (two) times daily. 10/30/23   [provider]  traMADol (ULTRAM) 50 MG tablet Take 1-2 tablets (50-100 mg total) by mouth every 6 (six) hours as needed for moderate pain (pain score 4-6) or severe pain (pain score 7-10). 12/13/23   Carrolyn Clan, PA-C      Allergies    Patient has no known allergies.    Review of Systems   Review of Systems  Neurological:  Positive for  headaches.    Physical Exam Updated Vital Signs BP (!) 169/98 (BP Location: Right Arm)   Pulse 92   Temp 98.4 F (36.9 C)   Resp 16   SpO2 97%  Physical Exam Vitals and nursing note reviewed.  Constitutional:      General: He is not in acute distress.    Appearance: He is well-developed. He is not diaphoretic.  HENT:     Head: Normocephalic and atraumatic.  Eyes:     General: No scleral icterus.    Conjunctiva/sclera: Conjunctivae normal.  Cardiovascular:     Rate and Rhythm: Normal rate and regular rhythm.     Heart sounds: Normal heart sounds.  Pulmonary:     Effort: Pulmonary effort is normal. No respiratory distress.     Breath sounds: Normal breath sounds.  Abdominal:     Palpations: Abdomen is soft.     Tenderness: There is no abdominal tenderness.  Musculoskeletal:     Cervical back: Normal range of motion and neck supple.  Skin:    General: Skin is warm and dry.  Neurological:     Mental Status: He is alert.     GCS: GCS eye subscore is 4. GCS verbal subscore is 5. GCS motor subscore is 6.     Cranial Nerves: No cranial nerve deficit.     Sensory: No sensory deficit.     Deep Tendon Reflexes: Reflexes normal.  Psychiatric:        Mood and Affect: Mood is anxious.        Behavior: Behavior normal.     ED Results / Procedures / Treatments   Labs (all labs ordered are listed, but only abnormal results are displayed) Labs Reviewed - No data to display  EKG None  Radiology No results found.  Procedures .Nerve Block  Date/Time: 02/07/2024 3:37 PM  Performed by: Tama Fails, PA-C Authorized by: Tama Fails, PA-C   Consent:    Consent obtained:  Verbal   Consent given by:  Patient   Risks, benefits, and alternatives were discussed: yes     Risks discussed:  Allergic reaction, infection, nerve damage, swelling, unsuccessful block, pain, intravenous injection and bleeding   Alternatives discussed:  No treatment Universal protocol:    Patient  identity confirmed:  Verbally with patient Indications:    Indications:  Pain relief Location:    Body area:  Head   Head nerve blocked: Greater and lesser occiptal nerve blocks.   Laterality:  Bilateral Pre-procedure details:    Skin preparation:  Chlorhexidine   Preparation: Patient was prepped and draped in usual sterile fashion   Skin anesthesia:    Skin anesthesia method:  None Procedure details:    Block needle gauge:  27 G   Anesthetic injected:  Bupivacaine 0.5% w/o epi   Steroid injected:  Dexamethasone   Injection procedure:  Anatomic landmarks identified, incremental injection, negative aspiration for blood and anatomic landmarks palpated   Paresthesia:  None Post-procedure details:    Dressing:  None   Outcome:  Anesthesia achieved   Procedure completion:  Tolerated well, no immediate complications     Medications Ordered in ED Medications - No data to display  ED Course/ Medical Decision Making/ A&P                                 Medical Decision Making Risk Prescription drug management.   Patient here with symptoms that are frankly extremely consistent with a diagnosis of occipital neuralgia.  Although it is less common to have a bilaterally symptoms are consistent with that diagnosis. He does not have any evidence of zoster or other infection.   Patient given BL greater and lesser occipital nerve block  Significant improvement in symptoms. Will discharge with baclofen and Neurology referral. Discussed return precautions       Final Clinical Impression(s) / ED Diagnoses Final diagnoses:  None    Rx / DC Orders ED Discharge Orders     None         Tama Fails, PA-C 02/07/24 1541    Hershel Los, MD 02/10/24 2318

## 2024-02-07 NOTE — Discharge Instructions (Signed)
 Follow up with the Neurologist for your occipital nerve pain. Contact a health care provider if: Your medicine is not working. You have new or worsening symptoms. Get help right away if: You have very bad head pain that does not go away. You have a sudden change in vision, balance, or speech. You have trouble breathing. You have chest pain. You have uncontrolled pain in the area that should be numbed. You have symptoms of a reaction to the nerve block. Symptoms of a reaction include: Numb lips. Changes to vision or hearing, such as blurred vision or ringing in your ears. A metallic taste in your mouth. Increased anxiety. Dizziness. Shaking (such as tremors), twitching, or seizures. Changes in bowel or bladder control.

## 2024-02-07 NOTE — ED Triage Notes (Addendum)
 Headache Sharp pains "shooting through my head" Was seen on 01/31/24 Same pains MRI 02/05/24  Patient frustrated with pain and no answers

## 2024-02-07 NOTE — ED Notes (Signed)

## 2024-02-18 ENCOUNTER — Ambulatory Visit (INDEPENDENT_AMBULATORY_CARE_PROVIDER_SITE_OTHER): Admitting: Neurology

## 2024-02-18 ENCOUNTER — Encounter: Payer: Self-pay | Admitting: Neurology

## 2024-02-18 VITALS — BP 130/84 | HR 82 | Ht 72.0 in | Wt 212.0 lb

## 2024-02-18 DIAGNOSIS — G4486 Cervicogenic headache: Secondary | ICD-10-CM | POA: Insufficient documentation

## 2024-02-18 MED ORDER — GABAPENTIN 300 MG PO CAPS: 300.0000 mg | ORAL_CAPSULE | Freq: Three times a day (TID) | ORAL | 11 refills | Status: DC

## 2024-02-18 NOTE — Progress Notes (Signed)
 Chief Complaint  Patient presents with   New Patient (Initial Visit)    Pt in 14, here  alone  Pt is referred by ED for occipital neuralgia. Pt states he has a pain that starts in the back of his head and moves towards the front. Pt states this pain started about 2 months ago and its on and off. Pt states he was given injections on the back of his head in the hospital and the pain has gotten better but is not gone.       ASSESSMENT AND PLAN  Christian Aguirre is a 63 y.o. male   Neck pain, headache  Have a component of cervicogenic musculoskeletal etiology  Much improved with trigger point injection, receiving physical therapy  Also suggested warm compression, as needed NSAIDs,  Gabapentin  up to 300 mg 3 times a day  DIAGNOSTIC DATA (LABS, IMAGING, TESTING) - I reviewed patient records, labs, notes, testing and imaging myself where available.   MEDICAL HISTORY:  Christian Aguirre, is a 63 year old male seen in request by his primary care at [facility] Milly Almas, [place] for evaluation of headaches, initial evaluation was on February 18, 2024  History is obtained from the patient and review of electronic medical records. I personally reviewed pertinent available imaging films in PACS.   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,  He suffered left eye a metal piece injury at work in August 2024, since then, he had multiple left eye surgery,  Around February 2025, he began to have frequent headaches, starting at occipital, upper nuchal region, stabbing sharp pain, lasting for few minutes or longer  He is dealing with anxiety," worry about him going to die" treated at emergency room on February 07, 2024, received trigger point injection, reported immediate and sustained improvement, headache is much better, still has intermittent mild to moderate pressure pain, but no longer have stabbing pain, he is receiving physical therapy  CT head and cervical  spine on January 05, 2024 showed no significant pathology  Laboratory in February, hemoglobin of 12.4, BMP creatinine of 1.18, potassium of 3.3    PHYSICAL EXAM:   Vitals:   02/18/24 1412 02/18/24 1422  BP: (!) 152/92 130/84  Pulse: 74 82  Weight: 212 lb (96.2 kg)   Height: 6' (1.829 m)    Not recorded     Body mass index is 28.75 kg/m.  PHYSICAL EXAMNIATION:  Gen: NAD, conversant, well nourised, well groomed                     Cardiovascular: Regular rate rhythm, no peripheral edema, warm, nontender. Eyes: Conjunctivae clear without exudates or hemorrhage Neck: Supple, no carotid bruits. Pulmonary: Clear to auscultation bilaterally   NEUROLOGICAL EXAM:  MENTAL STATUS: Speech/cognition: Awake, alert, oriented to history taking and casual conversation CRANIAL NERVES: CN II: Visual fields are full to confrontation. Pupils are round equal and briskly reactive to light. CN III, IV, VI: extraocular movement are normal. No ptosis. CN V: Facial sensation is intact to light touch CN VII: Face is symmetric with normal eye closure  CN VIII: Hearing is normal to causal conversation. CN IX, X: Phonation is normal. CN XI: Head turning and shoulder shrug are intact  MOTOR: There is no pronator drift of out-stretched arms. Muscle bulk and tone are normal. Muscle strength is normal.  REFLEXES: Reflexes are 2+ and symmetric at the biceps, triceps, knees, and ankles.  Plantar responses are flexor.  SENSORY: Intact to light touch, pinprick and vibratory sensation are intact in fingers and toes.  COORDINATION: There is no trunk or limb dysmetria noted.  GAIT/STANCE: Posture is normal. Gait is steady with normal steps, base, arm swing, and turning. Heel and toe walking are normal. Tandem gait is normal.  Romberg is absent.  REVIEW OF SYSTEMS:  Full 14 system review of systems performed and notable only for as above All other review of systems were negative.   ALLERGIES: No  Known Allergies  HOME MEDICATIONS: Current Outpatient Medications  Medication Sig Dispense Refill   acetaminophen  (TYLENOL ) 500 MG tablet Take 1,000 mg by mouth every 6 (six) hours as needed for moderate pain (pain score 4-6).     ALPRAZolam  (XANAX ) 0.5 MG tablet Take 0.5 tablets (0.25 mg total) by mouth at bedtime as needed for anxiety. (Patient taking differently: Take 0.5 mg by mouth 2 (two) times daily as needed for anxiety.) 10 tablet 0   amLODipine  (NORVASC ) 10 MG tablet Take 10 mg by mouth daily.     atorvastatin  (LIPITOR) 40 MG tablet Take 40 mg by mouth daily.     baclofen  (LIORESAL ) 10 MG tablet Take 0.5-1 tablets (5-10 mg total) by mouth 3 (three) times daily as needed (occipital neuralgia). 30 each 0   CONTOUR TEST test strip 1 each by Other route 2 (two) times daily.     cyclobenzaprine  (FLEXERIL ) 10 MG tablet Take 10 mg by mouth at bedtime as needed for muscle spasms.     Difluprednate  0.05 % EMUL Place 1 drop into the left eye 4 (four) times daily.     docusate sodium  (COLACE) 100 MG capsule Take 1 capsule (100 mg total) by mouth 2 (two) times daily.     doxazosin  (CARDURA ) 1 MG tablet Take 1 mg by mouth daily.     lidocaine  (LIDODERM ) 5 % Place 1 patch onto the skin daily. Remove & Discard patch within 12 hours or as directed by MD 30 patch 0   lisinopril  (PRINIVIL ,ZESTRIL ) 20 MG tablet Take 20 mg by mouth daily.     losartan  (COZAAR ) 100 MG tablet Take 100 mg by mouth daily.     metFORMIN (GLUCOPHAGE) 1000 MG tablet Take 1,000 mg by mouth daily with breakfast.     neomycin -polymyxin b-dexamethasone  (MAXITROL) 3.5-10000-0.1 OINT Place 1 Application into the left eye 4 (four) times daily as needed (dry eyes).     propranolol  (INDERAL ) 20 MG tablet Take 1 tablet (20 mg total) by mouth 2 (two) times daily. May also take 1 tablet (20 mg total) as needed (PALPITATIONS). 45 tablet 2   sertraline  (ZOLOFT ) 100 MG tablet Take 1 tablet by mouth daily.     Timolol  Maleate PF 0.5 % SOLN  Place 1 drop into the left eye 2 (two) times daily.     traMADol  (ULTRAM ) 50 MG tablet Take 1-2 tablets (50-100 mg total) by mouth every 6 (six) hours as needed for moderate pain (pain score 4-6) or severe pain (pain score 7-10). 20 tablet 0   nitroGLYCERIN  (NITROSTAT ) 0.4 MG SL tablet Place 1 tablet (0.4 mg total) under the tongue every 5 (five) minutes as needed. 11 tablet 6   No current facility-administered medications for this visit.    PAST MEDICAL HISTORY: Past Medical History:  Diagnosis Date   Anxiety    Diabetes mellitus without complication (HCC)    ETOH abuse    Hypertension     PAST SURGICAL HISTORY: Past Surgical  History:  Procedure Laterality Date   EYE SURGERY Left    metal removed from Left eye   05-2023   ROBOTIC ASSITED PARTIAL NEPHRECTOMY Right 12/13/2023   Procedure: XI RIGHT ROBOTIC ASSISTED LAPAROSCOPIC  PARTIAL NEPHRECTOMY;  Surgeon: Florencio Hunting, MD;  Location: [facility];  Service: Urology;  Laterality: Right;  210 MINUTES NEEDED FOR CASE    FAMILY HISTORY: History reviewed. No pertinent family history.  SOCIAL HISTORY: Social History   Socioeconomic History   Marital status: Single    Spouse name: Not on file   Number of children: Not on file   Years of education: Not on file   Highest education level: Not on file  Occupational History   Not on file  Tobacco Use   Smoking status: Former    Types: Cigarettes   Smokeless tobacco: Never   Tobacco comments:    Quit 20 years ago started at 19  Vaping Use   Vaping status: Never Used  Substance and Sexual Activity   Alcohol use: Yes    Alcohol/week: 1.0 standard drink of alcohol    Types: 1 Cans of beer per week    Comment: occasional   Drug use: Not Currently    Types: Marijuana    Comment: Occ   Sexual activity: Yes  Other Topics Concern   Not on file  Social History Narrative   Not on file   Social Drivers of Health   Financial Resource Strain: Low Risk  (09/14/2022)   Overall  Financial Resource Strain (CARDIA)    Difficulty of Paying Living Expenses: Not hard at all  Food Insecurity: Low Risk  (12/17/2023)   Received from [facility]   Hunger Vital Sign    Worried About Running Out of Food in the Last Year: Never true    Ran Out of Food in the Last Year: Never true  Transportation Needs: No Transportation Needs (12/17/2023)   Received from [facility]    In the past 12 months, has lack of reliable transportation kept you from medical appointments, meetings, work or from getting things needed for daily living? : No  Physical Activity: Sufficiently Active (09/14/2022)   Exercise Vital Sign    Days of Exercise per Week: 5 days    Minutes of Exercise per Session: 30 min  Stress: Not on file  Social Connections: Not on file  Intimate Partner Violence: Not At Risk (12/13/2023)   Humiliation, Afraid, Rape, and Kick questionnaire    Fear of Current or Ex-Partner: No    Emotionally Abused: No    Physically Abused: No    Sexually Abused: No      Phebe Brasil, M.D. Ph.D.  [facility] [address] [place], [place] [address] Ph: [phone] Fax: [phone]  CC:  Tama Fails, PA-C No address on file  Lavenia Post., MD

## 2024-03-05 ENCOUNTER — Telehealth: Payer: Self-pay | Admitting: Neurology

## 2024-03-05 DIAGNOSIS — G4486 Cervicogenic headache: Secondary | ICD-10-CM

## 2024-03-05 MED ORDER — GABAPENTIN 300 MG PO CAPS: 600.0000 mg | ORAL_CAPSULE | Freq: Three times a day (TID) | ORAL | 11 refills | Status: AC

## 2024-03-05 NOTE — Addendum Note (Signed)
 Addended by: Devaeh Amadi on: 03/05/2024 03:16 PM   Modules accepted: Orders

## 2024-03-05 NOTE — Telephone Encounter (Signed)
 Lvm 1st attempt by hf 03/05/24

## 2024-03-05 NOTE — Telephone Encounter (Signed)
 Meds ordered this encounter  Medications   gabapentin  (NEURONTIN ) 300 MG capsule    Sig: Take 2 capsules (600 mg total) by mouth 3 (three) times daily.    Dispense:  180 capsule    Refill:  11     I have called in higher dose of gabapentin  300 mg up to 2 tablets 3 times a day,  Please also give him follow-up visit with nurse practitioner or virtual visit with me at next available

## 2024-03-05 NOTE — Telephone Encounter (Signed)
 Call to patient, reviewed Dr. Gracie Lav medication adjustments. Patient verbalized understanding. He would like to have MRI requested from [facility] for Dr. Gracie Lav to review and have second opinion as related to headaches

## 2024-03-05 NOTE — Telephone Encounter (Signed)
 Pt returned call and stated that his ha's are now daily, worsening intensity at times accompanied by dizziness. Pt takes the 100mg  gabapentin  tid. Denied taking anything Over the counter for it. Only takes the gabapentin . Told pt that I would let Dr. Gracie Lav know and see what her recommendations are. Pt voiced understanding to expect call back once Dr. Gracie Lav reviews and makes her recommendations.

## 2024-03-05 NOTE — Telephone Encounter (Signed)
 Pt is asking for a call to discuss the continued daily headaches

## 2024-03-05 NOTE — Telephone Encounter (Signed)
 Call to patient, no answer. Left message to return call

## 2024-03-26 ENCOUNTER — Encounter (INDEPENDENT_AMBULATORY_CARE_PROVIDER_SITE_OTHER): Payer: Self-pay | Admitting: Neurology

## 2024-03-26 DIAGNOSIS — G4486 Cervicogenic headache: Secondary | ICD-10-CM | POA: Diagnosis not present

## 2024-03-27 ENCOUNTER — Telehealth: Payer: Self-pay | Admitting: Neurology

## 2024-03-27 NOTE — Telephone Encounter (Signed)
 Referral for pain clinic fax to [facility]. Phone: [phone], Fax: [phone]

## 2024-03-27 NOTE — Telephone Encounter (Signed)
Please see the MyChart message reply(ies) for my assessment and plan.    This patient gave consent for this Medical Advice Message and is aware that it may result in a bill to [employer], as well as the possibility of receiving a bill for a co-payment or deductible. They are an established patient, but are not seeking medical advice exclusively about a problem treated during an in person or video visit in the last seven days. I did not recommend an in person or video visit within seven days of my reply.    I spent a total of 6 minutes cumulative time within 7 days through [employer].  Marcial Pacas, MD

## 2024-04-14 ENCOUNTER — Telehealth: Payer: Self-pay | Admitting: Neurology

## 2024-04-14 NOTE — Telephone Encounter (Signed)
 Appointment details confirmed

## 2024-04-17 ENCOUNTER — Telehealth: Payer: Self-pay | Admitting: Neurology

## 2024-04-17 ENCOUNTER — Ambulatory Visit (INDEPENDENT_AMBULATORY_CARE_PROVIDER_SITE_OTHER): Payer: PRIVATE HEALTH INSURANCE | Admitting: Neurology

## 2024-04-17 ENCOUNTER — Encounter: Payer: Self-pay | Admitting: Neurology

## 2024-04-17 VITALS — BP 128/68 | Ht 72.0 in | Wt 219.0 lb

## 2024-04-17 DIAGNOSIS — G4486 Cervicogenic headache: Secondary | ICD-10-CM | POA: Diagnosis not present

## 2024-04-17 DIAGNOSIS — I6381 Other cerebral infarction due to occlusion or stenosis of small artery: Secondary | ICD-10-CM | POA: Diagnosis not present

## 2024-04-17 NOTE — Progress Notes (Signed)
 Chief Complaint  Patient presents with   Follow-up    Rm 15, review MRI and headaches      ASSESSMENT AND PLAN  Christian Aguirre is a 63 y.o. 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   MEDICAL HISTORY:  Christian Aguirre, is a 63 year old male seen in request by his primary care at [facility] Milly Almas, [place] for evaluation of headaches, initial evaluation was on February 18, 2024  History is obtained from the patient and review of electronic medical records. I personally reviewed pertinent available imaging films in PACS.   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,  He suffered left eye a metal piece injury at work in August 2024, since then, he had multiple left eye surgery,  Around February 2025, he began to have frequent headaches, starting at occipital, upper nuchal region, stabbing sharp pain, lasting for few minutes or longer  He is dealing with anxiety, worry about him going to die treated at emergency room on February 07, 2024, received trigger point injection, reported immediate and sustained improvement, headache is much better, still has intermittent mild to moderate pressure pain, but no longer have stabbing pain, he is receiving physical therapy  CT head and cervical spine on January 05, 2024 showed no significant pathology  Laboratory in February, hemoglobin of 12.4, BMP creatinine of 1.18, potassium of 3.3  UPDATE April 17 2024: He had left  eye operation in August 2024, metal piece was taken out, had two more eye operation later, see ophthalmologist Dr. Cain Castillo, well-preserved left eye vision,   Had MRI brain/orbit w/wo on April 8th 2025 at [place],   Globes: Symmetric, normal contour and signal. Left pseudophakia.   Optic nerves: Normal size, contour, and signal bilaterally.   Extraocular muscles: Remote right medial orbital wall fracture with herniation of orbital fat and oblique orientation the right medial rectus muscle which is partially herniated into the fracture defect. Otherwise the extraocular muscles appear symmetric with normal signal and size.   Contrast: No abnormal enhancement to suggest mass or infection.   Additional findings: Remote left caudate head infarct. 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 Rightward nasal septal deviation with right nasal septal spur.   His headache has much improved, following trigger point injection, 4/10, intermittent, he is getting physical therapy, dry needling,   He is now seeing psychiatrist since left eye injury due to anxiety, nightmare due to accident,   PHYSICAL EXAM:   Vitals:   04/17/24 1420  BP: 128/68  Weight: 219 lb (99.3 kg)  Height: 6' (1.829 m)     Body mass index is 29.7 kg/m.  PHYSICAL EXAMNIATION:  Gen: NAD, conversant, well nourised, well groomed                     Cardiovascular: Regular rate rhythm, no peripheral edema, warm, nontender. Eyes: Conjunctivae clear without exudates or hemorrhage Neck: Supple, no carotid bruits. Pulmonary: Clear  to auscultation bilaterally   NEUROLOGICAL EXAM:  MENTAL STATUS: Speech/cognition: Anxious looking middle-age male, awake, alert, oriented to history taking and casual conversation CRANIAL NERVES: CN II: Visual fields are full to confrontation. Pupils are round equal and briskly reactive to light. CN III, IV, VI: extraocular movement are normal.  Static left ptosis CN V: Facial  sensation is intact to light touch CN VII: Face is symmetric with normal eye closure  CN VIII: Hearing is normal to causal conversation. CN IX, X: Phonation is normal. CN XI: Head turning and shoulder shrug are intact  MOTOR: There is no pronator drift of out-stretched arms. Muscle bulk and tone are normal. Muscle strength is normal.  REFLEXES: Reflexes are 1 and symmetric at the biceps, triceps, knees, and ankles. Plantar responses are flexor.  SENSORY: Intact to light touch, pinprick and vibratory sensation are intact in fingers and toes.  COORDINATION: There is no trunk or limb dysmetria noted.  GAIT/STANCE: Posture is normal. Gait is steady   REVIEW OF SYSTEMS:  Full 14 system review of systems performed and notable only for as above All other review of systems were negative.   ALLERGIES: No Known Allergies  HOME MEDICATIONS: Current Outpatient Medications  Medication Sig Dispense Refill   acetaminophen  (TYLENOL ) 500 MG tablet Take 1,000 mg by mouth every 6 (six) hours as needed for moderate pain (pain score 4-6).     ALPRAZolam  (XANAX ) 0.5 MG tablet Take 0.5 tablets (0.25 mg total) by mouth at bedtime as needed for anxiety. 10 tablet 0   amLODipine  (NORVASC ) 10 MG tablet Take 10 mg by mouth daily.     atorvastatin  (LIPITOR) 40 MG tablet Take 40 mg by mouth daily.     baclofen  (LIORESAL ) 10 MG tablet Take 0.5-1 tablets (5-10 mg total) by mouth 3 (three) times daily as needed (occipital neuralgia). 30 each 0   CONTOUR TEST test strip 1 each by Other route 2 (two) times daily.     cyclobenzaprine  (FLEXERIL ) 10 MG tablet Take 10 mg by mouth at bedtime as needed for muscle spasms.     Difluprednate  0.05 % EMUL Place 1 drop into the left eye 4 (four) times daily.     docusate sodium  (COLACE) 100 MG capsule Take 1 capsule (100 mg total) by mouth 2 (two) times daily.     doxazosin  (CARDURA ) 1 MG tablet Take 1 mg by mouth daily.     lidocaine  (LIDODERM ) 5 % Place 1 patch onto  the skin daily. Remove & Discard patch within 12 hours or as directed by MD 30 patch 0   lisinopril  (PRINIVIL ,ZESTRIL ) 20 MG tablet Take 20 mg by mouth daily.     losartan  (COZAAR ) 100 MG tablet Take 100 mg by mouth daily.     metFORMIN (GLUCOPHAGE) 1000 MG tablet Take 1,000 mg by mouth daily with breakfast.     neomycin -polymyxin b-dexamethasone  (MAXITROL) 3.5-10000-0.1 OINT Place 1 Application into the left eye 4 (four) times daily as needed (dry eyes).     nitroGLYCERIN  (NITROSTAT ) 0.4 MG SL tablet Place 1 tablet (0.4 mg total) under the tongue every 5 (five) minutes as needed. 11 tablet 6   propranolol  (INDERAL ) 20 MG tablet Take 1 tablet (20 mg total) by mouth 2 (two) times daily. May also take 1 tablet (20 mg total) as needed (PALPITATIONS). 45 tablet 2   sertraline  (ZOLOFT ) 100 MG tablet Take 1 tablet by mouth daily.     Timolol  Maleate PF 0.5 % SOLN Place 1 drop into the  left eye 2 (two) times daily.     traMADol  (ULTRAM ) 50 MG tablet Take 1-2 tablets (50-100 mg total) by mouth every 6 (six) hours as needed for moderate pain (pain score 4-6) or severe pain (pain score 7-10). 20 tablet 0   gabapentin  (NEURONTIN ) 300 MG capsule Take 2 capsules (600 mg total) by mouth 3 (three) times daily. 180 capsule 11   No current facility-administered medications for this visit.    PAST MEDICAL HISTORY: Past Medical History:  Diagnosis Date   Anxiety    Diabetes mellitus without complication (HCC)    ETOH abuse    Hypertension     PAST SURGICAL HISTORY: Past Surgical History:  Procedure Laterality Date   EYE SURGERY Left    metal removed from Left eye   05-2023   ROBOTIC ASSITED PARTIAL NEPHRECTOMY Right 12/13/2023   Procedure: XI RIGHT ROBOTIC ASSISTED LAPAROSCOPIC  PARTIAL NEPHRECTOMY;  Surgeon: Florencio Hunting, MD;  Location: [facility];  Service: Urology;  Laterality: Right;  210 MINUTES NEEDED FOR CASE    FAMILY HISTORY: No family history on file.  SOCIAL HISTORY: Social History    Socioeconomic History   Marital status: Single    Spouse name: Not on file   Number of children: Not on file   Years of education: Not on file   Highest education level: Not on file  Occupational History   Not on file  Tobacco Use   Smoking status: Former    Types: Cigarettes   Smokeless tobacco: Never   Tobacco comments:    Quit 20 years ago started at 31  Vaping Use   Vaping status: Never Used  Substance and Sexual Activity   Alcohol use: Yes    Alcohol/week: 1.0 standard drink of alcohol    Types: 1 Cans of beer per week    Comment: occasional   Drug use: Not Currently    Types: Marijuana    Comment: Occ   Sexual activity: Yes  Other Topics Concern   Not on file  Social History Narrative   Not on file   Social Drivers of Health   Financial Resource Strain: Low Risk  (09/14/2022)   Overall Financial Resource Strain (CARDIA)    Difficulty of Paying Living Expenses: Not hard at all  Food Insecurity: Low Risk  (03/31/2024)   Received from [facility]   Hunger Vital Sign    Within the past 12 months, you worried that your food would run out before you got money to buy more: Never true    Within the past 12 months, the food you bought just didn't last and you didn't have money to get more. : Never true  Transportation Needs: No Transportation Needs (03/31/2024)   Received from [facility]    In the past 12 months, has lack of reliable transportation kept you from medical appointments, meetings, work or from getting things needed for daily living? : No  Physical Activity: Sufficiently Active (09/14/2022)   Exercise Vital Sign    Days of Exercise per Week: 5 days    Minutes of Exercise per Session: 30 min  Stress: Not on file  Social Connections: Not on file  Intimate Partner Violence: Not At Risk (12/13/2023)   Humiliation, Afraid, Rape, and Kick questionnaire    Fear of Current or Ex-Partner: No    Emotionally Abused: No    Physically Abused: No     Sexually Abused: No      Phebe Brasil, M.D. Ph.D.  [facility] [address] [place], [place] [address] Ph: [phone] Fax: [phone]  CC:  Lavenia Post., MD [address] [place],  [place] [address]  Ludwig Safer, PA-C

## 2024-04-17 NOTE — Telephone Encounter (Signed)
 He had MRI brain/orbit from [facility] on April 8th 2025, please get CD for me to review

## 2024-05-08 ENCOUNTER — Ambulatory Visit (HOSPITAL_COMMUNITY)
Admission: RE | Admit: 2024-05-08 | Discharge: 2024-05-08 | Disposition: A | Discharge status: Home / Self Care | Source: Ambulatory Visit | Attending: Neurology | Admitting: Neurology

## 2024-05-08 ENCOUNTER — Ambulatory Visit (HOSPITAL_BASED_OUTPATIENT_CLINIC_OR_DEPARTMENT_OTHER)
Admission: RE | Admit: 2024-05-08 | Discharge: 2024-05-08 | Disposition: A | Discharge status: Home / Self Care | Source: Ambulatory Visit | Attending: Neurology | Admitting: Neurology

## 2024-05-08 DIAGNOSIS — R42 Dizziness and giddiness: Secondary | ICD-10-CM

## 2024-05-08 DIAGNOSIS — I6381 Other cerebral infarction due to occlusion or stenosis of small artery: Secondary | ICD-10-CM

## 2024-05-08 DIAGNOSIS — R519 Headache, unspecified: Secondary | ICD-10-CM | POA: Diagnosis not present

## 2024-05-08 DIAGNOSIS — G4486 Cervicogenic headache: Secondary | ICD-10-CM | POA: Diagnosis present

## 2024-05-08 LAB — ECHOCARDIOGRAM COMPLETE
Area-P 1/2: 6.02 cm2
Calc EF: 55.6 %
S' Lateral: 3.4 cm
Single Plane A2C EF: 54.7 %
Single Plane A4C EF: 55.9 %

## 2024-05-08 NOTE — Progress Notes (Signed)
 VASCULAR LAB    Carotid duplex has been performed.  See CV proc for preliminary results.   Shaquel Josephson, RVT 05/08/2024, 1:30 PM

## 2024-05-12 ENCOUNTER — Ambulatory Visit: Payer: Self-pay | Admitting: Neurology

## 2024-05-15 NOTE — Progress Notes (Signed)
 Please call and inform patient that recent echo showed a ejection fraction of 50 to 55%. Dr. Onita will contact patient is additional recommendations are needed.

## 2024-05-19 NOTE — Telephone Encounter (Signed)
-----   Message from [facility] sent at 05/15/2024  9:54 PM EDT ----- Please call and inform patient that recent echo showed a ejection fraction of 50 to 55%. Dr. Onita will contact patient is additional recommendations are needed.  ----- Message ----- From: Oneita Hoist, CMA Sent: 05/12/2024   2:12 PM EDT To: Pastor Falling, MD  Work in advise  ----- Message ----- From: Interface, [employer] Sent: 05/08/2024   3:44 PM EDT To: Modena Onita, MD

## 2024-05-19 NOTE — Telephone Encounter (Signed)
 Pt called requesting to speak to nurse , Informed Nurse has sent message on Mychart  but also informed that he will get callback

## 2024-05-29 ENCOUNTER — Encounter: Payer: Self-pay | Admitting: Neurology

## 2024-06-10 ENCOUNTER — Telehealth: Admitting: Neurology

## 2024-06-10 NOTE — Telephone Encounter (Signed)
 Pt had to cx the appointment for this afternoon due to a conflict with another appointment, pt was told he will be called to get r/s by CMA or RN

## 2024-07-09 NOTE — Telephone Encounter (Signed)
 Pt has called as a result of not hearing from anyone to r/s his my chart vv appointment , pt now asking that he be scheduled as a in office appointment with Dr Onita

## 2024-09-17 ENCOUNTER — Telehealth: Payer: Self-pay | Admitting: Neurology

## 2024-09-17 NOTE — Telephone Encounter (Signed)
 LVM and sent mychart msg informing pt of need to reschedule 09/18/24 appt - MD out

## 2024-09-18 ENCOUNTER — Ambulatory Visit: Admitting: Neurology

## 2024-09-18 ENCOUNTER — Telehealth: Payer: Self-pay | Admitting: Neurology

## 2024-09-18 NOTE — Telephone Encounter (Signed)
 Pt has called to r/s his scheduled appointment for today due to provider being sick.  April, RN gave phone rep ok to use appointment slot for 01-29 for Dr Onita, pt also on wait list.

## 2024-11-21 ENCOUNTER — Other Ambulatory Visit: Payer: Self-pay

## 2024-11-21 ENCOUNTER — Emergency Department (HOSPITAL_BASED_OUTPATIENT_CLINIC_OR_DEPARTMENT_OTHER): Admission: EM | Admit: 2024-11-21 | Discharge: 2024-11-21 | Disposition: A | Discharge status: Home / Self Care

## 2024-11-21 ENCOUNTER — Encounter (HOSPITAL_BASED_OUTPATIENT_CLINIC_OR_DEPARTMENT_OTHER): Payer: Self-pay | Admitting: Emergency Medicine

## 2024-11-21 DIAGNOSIS — Z7984 Long term (current) use of oral hypoglycemic drugs: Secondary | ICD-10-CM | POA: Insufficient documentation

## 2024-11-21 DIAGNOSIS — E119 Type 2 diabetes mellitus without complications: Secondary | ICD-10-CM | POA: Insufficient documentation

## 2024-11-21 DIAGNOSIS — Z79899 Other long term (current) drug therapy: Secondary | ICD-10-CM | POA: Diagnosis not present

## 2024-11-21 DIAGNOSIS — I10 Essential (primary) hypertension: Secondary | ICD-10-CM | POA: Diagnosis not present

## 2024-11-21 DIAGNOSIS — R519 Headache, unspecified: Secondary | ICD-10-CM | POA: Diagnosis present

## 2024-11-21 DIAGNOSIS — M5481 Occipital neuralgia: Secondary | ICD-10-CM | POA: Diagnosis not present

## 2024-11-21 MED ORDER — BUPIVACAINE HCL 0.5 % IJ SOLN
10.0000 mL | Freq: Once | INTRAMUSCULAR | Status: AC
  Administered 2024-11-21: 10 mL
  Filled 2024-11-21: qty 1

## 2024-11-21 MED ORDER — BUPIVACAINE HCL 0.5 % IJ SOLN
10.0000 mL | INTRAMUSCULAR | Status: DC
  Administered 2024-11-21: 10 mL

## 2024-11-21 MED ORDER — DEXAMETHASONE SODIUM PHOSPHATE 4 MG/ML IJ SOLN: 4.0000 mg | Freq: Once | INTRAMUSCULAR | Status: DC

## 2024-11-21 NOTE — Discharge Instructions (Addendum)
 Is a pleasure taking care of you today.  Your work was reassuring.  Your presentation was consistent with a description of occipital neuralgia.  I did attempt to treat your symptoms with a nerve block today.  I inserted a numbing medication directly in the back of your head.  At this should have started to take effect by now.  It is possible you may have some relief, but the pain/headache may not disappear entirely.  Please continue taking the medications that you have been previously prescribed.  I also recommend you keep your appointment as scheduled with neurology.  Return to the emergency department for any worsening symptoms or life-threatening illnesses.

## 2024-11-21 NOTE — ED Provider Notes (Signed)
 " [place] [facility] DEPARTMENT AT [facility] Provider Note   CSN: [id number] Arrival date & time: 11/21/24  1633     Patient presents with: Headache   [name] is a 64 y.o. male with past medical history of occipital neuralgia, diabetes, hypertension, hyperlipidemia, who presents emergency department for evaluation of a right sided occipital headache.  Patient has been suffering from these headaches, for the last 6 months.  He currently follows with a pain specialist, and has received multiple trigger point injections.  He did receive 1 in the emergency department several months ago, which he reported helped.  Patient is also taking gabapentin , with minimal relief.  He states he is scheduled to follow-up with neurology this coming week, but came to the emergency department for worsening symptoms.  He denies any changes to his headaches.  He denies visual changes, dizziness, LOC.    Headache      Prior to Admission medications  Medication Sig Start Date End Date Taking? Authorizing Provider  acetaminophen  (TYLENOL ) 500 MG tablet Take 1,000 mg by mouth every 6 (six) hours as needed for moderate pain (pain score 4-6).    [provider]  ALPRAZolam  (XANAX ) 0.5 MG tablet Take 0.5 tablets (0.25 mg total) by mouth at bedtime as needed for anxiety. 12/17/17   Revankar, Jennifer SAUNDERS, MD  amLODipine  (NORVASC ) 10 MG tablet Take 10 mg by mouth daily.    [provider]  atorvastatin  (LIPITOR) 40 MG tablet Take 40 mg by mouth daily.    [provider]  baclofen  (LIORESAL ) 10 MG tablet Take 0.5-1 tablets (5-10 mg total) by mouth 3 (three) times daily as needed (occipital neuralgia). 02/07/24   Harris, Abigail, PA-C  CONTOUR TEST test strip 1 each by Other route 2 (two) times daily.    [provider]  cyclobenzaprine  (FLEXERIL ) 10 MG tablet Take 10 mg by mouth at bedtime as needed for muscle spasms. 11/20/23   [provider]  Difluprednate  0.05  % EMUL Place 1 drop into the left eye 4 (four) times daily. 11/13/23   [provider]  docusate sodium  (COLACE) 100 MG capsule Take 1 capsule (100 mg total) by mouth 2 (two) times daily. 12/13/23   Cory Palma, PA-C  doxazosin  (CARDURA ) 1 MG tablet Take 1 mg by mouth daily.    [provider]  gabapentin  (NEURONTIN ) 300 MG capsule Take 2 capsules (600 mg total) by mouth 3 (three) times daily. 03/05/24   Onita Duos, MD  lidocaine  (LIDODERM ) 5 % Place 1 patch onto the skin daily. Remove & Discard patch within 12 hours or as directed by MD 11/15/23   Francis Ileana SAILOR, PA-C  lisinopril  (PRINIVIL ,ZESTRIL ) 20 MG tablet Take 20 mg by mouth daily.    [provider]  losartan  (COZAAR ) 100 MG tablet Take 100 mg by mouth daily.    [provider]  metFORMIN (GLUCOPHAGE) 1000 MG tablet Take 1,000 mg by mouth daily with breakfast.    [provider]  neomycin -polymyxin b-dexamethasone  (MAXITROL) 3.5-10000-0.1 OINT Place 1 Application into the left eye 4 (four) times daily as needed (dry eyes). 11/13/23   [provider]  nitroGLYCERIN  (NITROSTAT ) 0.4 MG SL tablet Place 1 tablet (0.4 mg total) under the tongue every 5 (five) minutes as needed. 12/17/17 04/17/24  Revankar, Jennifer SAUNDERS, MD  propranolol  (INDERAL ) 20 MG tablet Take 1 tablet (20 mg total) by mouth 2 (two) times daily. May also take 1 tablet (20 mg total) as needed (PALPITATIONS).  09/18/23   Vannie Reche RAMAN, NP  sertraline  (ZOLOFT ) 100 MG tablet Take 1 tablet by mouth daily. 11/01/23 10/31/24  [provider]  Timolol  Maleate PF 0.5 % SOLN Place 1 drop into the left eye 2 (two) times daily. 10/30/23   [provider]  traMADol  (ULTRAM ) 50 MG tablet Take 1-2 tablets (50-100 mg total) by mouth every 6 (six) hours as needed for moderate pain (pain score 4-6) or severe pain (pain score 7-10). 12/13/23   Cory Palma, PA-C    Allergies: Patient has no known allergies.    Review of Systems   Neurological:  Positive for headaches.    Updated Vital Signs BP (!) 147/92 (BP Location: Right Arm)   Pulse 64   Temp 98.1 F (36.7 C)   Resp 18   SpO2 97%   Physical Exam Vitals and nursing note reviewed.  Constitutional:      Appearance: Normal appearance. He is not ill-appearing.  Eyes:     General: No scleral icterus. Pulmonary:     Effort: Pulmonary effort is normal. No respiratory distress.  Musculoskeletal:        General: Tenderness present. No deformity.     Comments: Patient with tenderness on the right occipital nerve to palpation.  The pain shoots up toward his head and does go down into his right shoulder  Skin:    Coloration: Skin is not jaundiced.  Neurological:     General: No focal deficit present.     Mental Status: He is alert.  Psychiatric:        Mood and Affect: Mood normal.     (all labs ordered are listed, but only abnormal results are displayed) Labs Reviewed - No data to display  EKG: None  Radiology: No results found.   [name]  Date/Time: 11/21/2024 7:36 PM  Performed by: Torrence Marry RAMAN, PA-C Authorized by: Torrence Marry RAMAN, PA-C   Consent:    Consent obtained:  Verbal   Consent given by:  Patient   Risks, benefits, and alternatives were discussed: yes     Risks discussed:  Infection, nerve damage, pain, swelling, bleeding and unsuccessful block   Alternatives discussed:  No treatment Universal protocol:    Procedure explained and questions answered to patient or proxy's satisfaction: yes     Patient identity confirmed:  Verbally with patient Indications:    Indications:  Pain relief Location:    Body area:  Head   Head nerve:  Occipital   Laterality:  Bilateral Pre-procedure details:    Skin preparation:  Chlorhexidine  Skin anesthesia:    Skin anesthesia method:  None Procedure details:    Block needle gauge:  27 G   Anesthetic injected:  10 mL bupivacaine  0.5 %   Steroid injected:  None   Additive  injected:  None   Injection procedure:  Anatomic landmarks palpated, negative aspiration for blood, introduced needle and incremental injection Post-procedure details:    Dressing:  None   Outcome:  Pain improved   Procedure completion:  Tolerated well, no immediate complications                                 Medical Decision Making Risk Prescription drug management.   64 year old male who presents emergency department for evaluation of a headache.  Differential diagnosis: Stroke, TIA, subdural bleeding, subarachnoid hemorrhage, migraine, occipital neuralgia, cervical pathology.  Patient has had this headache on and off  for the last 6 months, and has responded appropriately to an occipital nerve block.  He does not have any evidence of a rash, that would be concerning for zoster.  Patient's headache has not changed in symptoms, description or severity since onset did approximately 6 months ago.  I do not feel strongly about ordering a CT scan as this does not appear to be strokelike in etiology.  Patient's pain is reproducible when pushing on the occipital nerve, and is very consistent with a presentation of such.  I did perform an occipital nerve block on him, as this worked well in the past.  Please see procedure note for full detail.  Patient has follow-up with neurology on Thursday, which I recommended he keep.  He verbalizes understanding to this.  Vital signs are stable.  Patient is appropriate for discharge at this time.    Final diagnoses:  Bilateral occipital neuralgia    ED Discharge Orders     None          Torrence Marry GORMAN DEVONNA 11/21/24 2012    Simon Lavonia SAILOR, MD 11/21/24 2044  "

## 2024-11-21 NOTE — ED Triage Notes (Signed)
 Headache and neck pain Chronic Sees specialist Currently having severe pain and seeking some pain relief   Hoping she will give me some of those shots in the head, they help

## 2024-11-27 ENCOUNTER — Ambulatory Visit: Admitting: Neurology

## 2024-11-27 ENCOUNTER — Encounter: Payer: Self-pay | Admitting: Neurology

## 2024-11-27 VITALS — BP 135/82 | HR 74 | Ht 72.0 in | Wt 229.0 lb

## 2024-11-27 DIAGNOSIS — G4486 Cervicogenic headache: Secondary | ICD-10-CM | POA: Diagnosis not present

## 2024-11-27 DIAGNOSIS — I6381 Other cerebral infarction due to occlusion or stenosis of small artery: Secondary | ICD-10-CM

## 2024-11-27 DIAGNOSIS — G43709 Chronic migraine without aura, not intractable, without status migrainosus: Secondary | ICD-10-CM | POA: Diagnosis not present

## 2024-11-27 MED ORDER — CYCLOBENZAPRINE HCL 10 MG PO TABS: 10.0000 mg | ORAL_TABLET | Freq: Every evening | ORAL | 6 refills | Status: AC | PRN

## 2024-11-27 MED ORDER — QULIPTA 60 MG PO TABS: 60.0000 mg | ORAL_TABLET | Freq: Every day | ORAL | 6 refills | Status: AC

## 2024-11-27 NOTE — Progress Notes (Signed)
 "  Chief Complaint  Patient presents with   Follow-up    Pt in EMG room 4. Alone. Here for worsening  headaches      ASSESSMENT AND PLAN  Christian Aguirre is a 64 y.o. 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   MEDICAL HISTORY:  Christian Aguirre, is a 64 year old male seen in request by his primary care at [facility] [place] for evaluation of headaches, initial evaluation was on February 18, 2024  History is obtained from the patient and review of electronic medical records. I personally reviewed pertinent available imaging films in PACS.   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,  He suffered left eye a metal piece injury at work in August 2024, since then, he had multiple left eye surgery,  Around February 2025, he began to have frequent headaches, starting at occipital, upper nuchal region, stabbing sharp pain, lasting for few minutes or longer  He is dealing with anxiety, worry about him going to die treated at emergency room on February 07, 2024, received trigger point injection, reported immediate and sustained improvement, headache is much better, still has intermittent mild to moderate pressure pain, but no longer have stabbing pain, he is receiving physical  therapy  CT head and cervical spine on January 05, 2024 showed no significant pathology  Laboratory in February, hemoglobin of 12.4, BMP creatinine of 1.18, potassium of 3.3  UPDATE April 17 2024: He had left eye operation in August 2024, metal piece was taken out, had two more eye operation later, see ophthalmologist Dr. Festus, well-preserved left eye vision,   Had MRI brain/orbit w/wo on April 8th 2025 at [facility],   Globes: Symmetric, normal contour and signal. Left pseudophakia.   Optic nerves: Normal size, contour, and signal bilaterally.   Extraocular muscles: Remote right medial orbital wall fracture with herniation of orbital fat and oblique orientation the right medial rectus muscle which is partially herniated into the fracture defect. Otherwise the extraocular muscles appear symmetric with normal signal and size.   Contrast: No abnormal enhancement to suggest mass or infection.   Additional findings: Remote left caudate head infarct. 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 Rightward nasal septal deviation with right nasal septal spur.   His headache has much improved, following trigger point injection, 4/10, intermittent, he is getting physical therapy, dry needling,   He is now seeing psychiatrist since left eye injury due to anxiety, nightmare due to accident,   UPDATE Jan 29th 2026: He is alone at today's visit, feels discouraged, continue to deal with his left eye issue  MRI of the brain in April 2025 at [facility] showed remote left head lacunar infarction,  He does has vascular risk factors of aging, hypertension, diabetes, on aspirin  as stroke prevention, Echocardiogram July 2025 showed no  significant abnormalities. Ultrasound of carotid artery showed no large vessel disease  He was not able to go back to his job due to left eye issues, also complains of frequent headaches starting at the back part of her neck spreading forward, pressure sensation, all  day long, couple times per week it would exacerbate to a more severe headache, has to lie down, he admitted depression anxiety, taking gabapentin  600 mg 3 times daily, does help him sleep better, also on Zoloft   PHYSICAL EXAM:   Vitals:   11/27/24 1500  BP: 135/82  Pulse: 74  SpO2: 96%  Weight: 229 lb (103.9 kg)  Height: 6' (1.829 m)     Body mass index is 31.06 kg/m.  PHYSICAL EXAMNIATION:  Gen: NAD, conversant, well nourised, well groomed                     Cardiovascular: Regular rate rhythm, no peripheral edema, warm, nontender. Eyes: Conjunctivae clear without exudates or hemorrhage Neck: Supple, no carotid bruits. Pulmonary: Clear to auscultation bilaterally   NEUROLOGICAL EXAM:  MENTAL STATUS: Speech/cognition: Anxious looking middle-age male, awake, alert, oriented to history taking and casual conversation CRANIAL NERVES: CN II: Visual fields are full to confrontation. Pupils are round equal and briskly reactive to light. CN III, IV, VI: extraocular movement are normal.  Static left ptosis CN V: Facial sensation is intact to light touch CN VII: Face is symmetric with normal eye closure  CN VIII: Hearing is normal to causal conversation. CN IX, X: Phonation is normal. CN XI: Head turning and shoulder shrug are intact  MOTOR: There is no pronator drift of out-stretched arms. Muscle bulk and tone are normal. Muscle strength is normal.  REFLEXES: Reflexes are 1 and symmetric at the biceps, triceps, knees, and ankles. Plantar responses are flexor.  SENSORY: Intact to light touch, pinprick and vibratory sensation are intact in fingers and toes.  COORDINATION: There is no trunk or limb dysmetria noted.  GAIT/STANCE: Posture is normal. Gait is steady   REVIEW OF SYSTEMS:  Full 14 system review of systems performed and notable only for as above All other review of systems were negative.   ALLERGIES: No Known Allergies  HOME MEDICATIONS: Current Outpatient  Medications  Medication Sig Dispense Refill   acetaminophen  (TYLENOL ) 500 MG tablet Take 1,000 mg by mouth every 6 (six) hours as needed for moderate pain (pain score 4-6).     ALPRAZolam  (XANAX ) 0.5 MG tablet Take 0.5 tablets (0.25 mg total) by mouth at bedtime as needed for anxiety. 10 tablet 0   amLODipine  (NORVASC ) 10 MG tablet Take 10 mg by mouth daily.     atorvastatin  (LIPITOR) 40 MG tablet Take 40 mg by mouth daily.     baclofen  (LIORESAL ) 10 MG tablet Take 0.5-1 tablets (5-10 mg total) by mouth 3 (three) times daily as needed (occipital neuralgia). 30 each 0   CONTOUR TEST test strip 1 each by Other route 2 (two) times daily.     cyclobenzaprine  (FLEXERIL ) 10 MG tablet Take 10 mg by mouth at bedtime as needed for muscle spasms.     Difluprednate  0.05 % EMUL Place 1 drop into the left eye 4 (four) times daily.     docusate sodium  (COLACE) 100 MG capsule Take 1 capsule (100 mg total) by mouth 2 (two) times daily.     doxazosin  (CARDURA ) 1 MG tablet Take 1 mg by mouth daily.     gabapentin  (NEURONTIN ) 300 MG capsule Take 2 capsules (  600 mg total) by mouth 3 (three) times daily. 180 capsule 11   lidocaine  (LIDODERM ) 5 % Place 1 patch onto the skin daily. Remove & Discard patch within 12 hours or as directed by MD 30 patch 0   lisinopril  (PRINIVIL ,ZESTRIL ) 20 MG tablet Take 20 mg by mouth daily.     losartan  (COZAAR ) 100 MG tablet Take 100 mg by mouth daily.     metFORMIN (GLUCOPHAGE) 1000 MG tablet Take 1,000 mg by mouth daily with breakfast.     neomycin -polymyxin b-dexamethasone  (MAXITROL) 3.5-10000-0.1 OINT Place 1 Application into the left eye 4 (four) times daily as needed (dry eyes).     nitroGLYCERIN  (NITROSTAT ) 0.4 MG SL tablet Place 1 tablet (0.4 mg total) under the tongue every 5 (five) minutes as needed. 11 tablet 6   propranolol  (INDERAL ) 20 MG tablet Take 1 tablet (20 mg total) by mouth 2 (two) times daily. May also take 1 tablet (20 mg total) as needed (PALPITATIONS). 45 tablet  2   sertraline  (ZOLOFT ) 100 MG tablet Take 1 tablet by mouth daily.     Timolol  Maleate PF 0.5 % SOLN Place 1 drop into the left eye 2 (two) times daily.     traMADol  (ULTRAM ) 50 MG tablet Take 1-2 tablets (50-100 mg total) by mouth every 6 (six) hours as needed for moderate pain (pain score 4-6) or severe pain (pain score 7-10). 20 tablet 0   No current facility-administered medications for this visit.    PAST MEDICAL HISTORY: Past Medical History:  Diagnosis Date   Anxiety    Diabetes mellitus without complication (HCC)    ETOH abuse    Hypertension     PAST SURGICAL HISTORY: Past Surgical History:  Procedure Laterality Date   EYE SURGERY Left    metal removed from Left eye   05-2023   ROBOTIC ASSITED PARTIAL NEPHRECTOMY Right 12/13/2023   Procedure: XI RIGHT ROBOTIC ASSISTED LAPAROSCOPIC  PARTIAL NEPHRECTOMY;  Surgeon: Renda Glance, MD;  Location: [facility];  Service: Urology;  Laterality: Right;  210 MINUTES NEEDED FOR CASE    FAMILY HISTORY: History reviewed. No pertinent family history.  SOCIAL HISTORY: Social History   Socioeconomic History   Marital status: Single    Spouse name: Not on file   Number of children: Not on file   Years of education: Not on file   Highest education level: Not on file  Occupational History   Not on file  Tobacco Use   Smoking status: Former    Types: Cigarettes   Smokeless tobacco: Never   Tobacco comments:    Quit 20 years ago started at 52  Vaping Use   Vaping status: Never Used  Substance and Sexual Activity   Alcohol use: Yes    Alcohol/week: 1.0 standard drink of alcohol    Types: 1 Cans of beer per week    Comment: occasional   Drug use: Not Currently    Types: Marijuana    Comment: Occ   Sexual activity: Yes  Other Topics Concern   Not on file  Social History Narrative   Not on file   Social Drivers of Health   Tobacco Use: Medium Risk (11/27/2024)   Patient History    Smoking Tobacco Use: Former    Smokeless  Tobacco Use: Never    Passive Exposure: Not on file  Financial Resource Strain: Low Risk (09/14/2022)   Overall Financial Resource Strain (CARDIA)    Difficulty of Paying Living Expenses: Not hard at all  Food Insecurity: Low Risk (03/31/2024)   Received from [facility]   Epic    Within the past 12 months, you worried that your food would run out before you got money to buy more: Never true    Within the past 12 months, the food you bought just didn't last and you didn't have money to get more. : Never true  Transportation Needs: No Transportation Needs (03/31/2024)   Received from [facility]    In the past 12 months, has lack of reliable transportation kept you from medical appointments, meetings, work or from getting things needed for daily living? : No  Physical Activity: Sufficiently Active (09/14/2022)   Exercise Vital Sign    Days of Exercise per Week: 5 days    Minutes of Exercise per Session: 30 min  Stress: Not on file  Social Connections: Not on file  Intimate Partner Violence: Not At Risk (12/13/2023)   Humiliation, Afraid, Rape, and Kick questionnaire    Fear of Current or Ex-Partner: No    Emotionally Abused: No    Physically Abused: No    Sexually Abused: No  Depression (PHQ2-9): Not on file  Alcohol Screen: Low Risk (09/14/2022)   Alcohol Screen    Last Alcohol Screening Score (AUDIT): 3  Housing: Low Risk (03/31/2024)   Received from [facility]   Epic    What is your living situation today?: I have a steady place to live    Think about the place you live. Do you have problems with any of the following? Choose all that apply:: None/None on this list  Utilities: Low Risk (03/31/2024)   Received from [facility]   Utilities    In the past 12 months has the electric, gas, oil, or water  company threatened to shut off services in your home? : No  Health Literacy: Not on file      Modena Callander, M.D. Ph.D.  [facility] [address], [address] [place], [place] [address] Ph: [phone] Fax: [phone]  CC:  Gretta Ole Birmingham, PA-C [address] [place],  [place] [address]  Gretta Ole Birmingham, PA-C   "
